# Patient Record
Sex: Male | Born: 1940
Health system: Southern US, Community
[De-identification: ages and names within clinical notes are randomized; demographics above are authoritative.]

## PROBLEM LIST (undated history)

## (undated) DIAGNOSIS — N400 Enlarged prostate without lower urinary tract symptoms: Secondary | ICD-10-CM

## (undated) DIAGNOSIS — I10 Essential (primary) hypertension: Secondary | ICD-10-CM

## (undated) DIAGNOSIS — E119 Type 2 diabetes mellitus without complications: Secondary | ICD-10-CM

## (undated) DIAGNOSIS — R011 Cardiac murmur, unspecified: Secondary | ICD-10-CM

## (undated) DIAGNOSIS — J939 Pneumothorax, unspecified: Secondary | ICD-10-CM

## (undated) HISTORY — DX: Pneumothorax, unspecified: J93.9

## (undated) HISTORY — PX: CATARACT EXTRACTION, BILATERAL: SHX1313

---

## 2004-10-24 ENCOUNTER — Ambulatory Visit (HOSPITAL_COMMUNITY): Admission: RE | Admit: 2004-10-24 | Discharge: 2004-10-24 | Payer: Self-pay | Admitting: *Deleted

## 2014-10-15 HISTORY — PX: OTHER SURGICAL HISTORY: SHX169

## 2015-11-24 DIAGNOSIS — E139 Other specified diabetes mellitus without complications: Secondary | ICD-10-CM | POA: Diagnosis not present

## 2015-12-28 ENCOUNTER — Other Ambulatory Visit: Payer: Self-pay | Admitting: Surgery

## 2015-12-28 DIAGNOSIS — E119 Type 2 diabetes mellitus without complications: Secondary | ICD-10-CM | POA: Diagnosis not present

## 2015-12-28 DIAGNOSIS — K4090 Unilateral inguinal hernia, without obstruction or gangrene, not specified as recurrent: Secondary | ICD-10-CM | POA: Diagnosis not present

## 2016-01-03 DIAGNOSIS — E119 Type 2 diabetes mellitus without complications: Secondary | ICD-10-CM | POA: Diagnosis not present

## 2016-01-03 DIAGNOSIS — I10 Essential (primary) hypertension: Secondary | ICD-10-CM | POA: Diagnosis not present

## 2016-01-09 DIAGNOSIS — E119 Type 2 diabetes mellitus without complications: Secondary | ICD-10-CM | POA: Diagnosis not present

## 2016-01-09 DIAGNOSIS — R011 Cardiac murmur, unspecified: Secondary | ICD-10-CM | POA: Diagnosis not present

## 2016-01-09 DIAGNOSIS — I10 Essential (primary) hypertension: Secondary | ICD-10-CM | POA: Diagnosis not present

## 2016-01-09 DIAGNOSIS — E7800 Pure hypercholesterolemia, unspecified: Secondary | ICD-10-CM | POA: Diagnosis not present

## 2016-01-09 NOTE — Patient Instructions (Addendum)
Vella RaringJames W Mcphail  01/09/2016   Your procedure is scheduled on: 01-17-16  [facility]  [facility]  [facility] to 3rd floor to  Short Stay Center at 515  AM.  Call this number if you have problems the morning of surgery [phone]   Remember: ONLY 1 PERSON MAY GO WITH YOU TO SHORT STAY TO GET  READY MORNING OF YOUR SURGERY.  Do not eat food or drink liquids :After Midnight.    Take these medicines the morning of surgery with A SIP OF WATER: TAMSULOSIN (FLOMAX), OCEAN NASAL SPRAY, LIPITOR DO NOT TAKE ANY DIABETIC MEDICATIONS DAY OF YOUR SURGERY                               You may not have any metal on your body including hair pins and              piercings  Do not wear jewelry, make-up, lotions, powders or perfumes, deodorant             Do not wear nail polish.  Do not shave  48 hours prior to surgery.              Men may shave face and neck.   Do not bring valuables to the hospital. [facility] IS NOT             RESPONSIBLE   FOR VALUABLES.  Contacts, dentures or bridgework may not be worn into surgery.  Leave suitcase in the car. After surgery it may be brought to your room.     Patients discharged the day of surgery will not be allowed to drive home.  Name and phone number of your driver:  Special Instructions: N/A              Please read over the following fact sheets you were given: _____________________________________________________________________             [facility] - Preparing for Surgery Before surgery, you can play an important role.  Because skin is not sterile, your skin needs to be as free of germs as possible.  You can reduce the number of germs on your skin by washing with CHG (chlorahexidine gluconate) soap before surgery.  CHG is an antiseptic cleaner which kills germs and bonds with the skin to continue killing germs even after washing. Please DO NOT use if you have an allergy to CHG or antibacterial soaps.   If your skin becomes reddened/irritated stop using the CHG and inform your nurse when you arrive at Short Stay. Do not shave (including legs and underarms) for at least 48 hours prior to the first CHG shower.  You may shave your face/neck. Please follow these instructions carefully:  1.  Shower with CHG Soap the night before surgery and the  morning of Surgery.  2.  If you choose to wash your hair, wash your hair first as usual with your  normal  shampoo.  3.  After you shampoo, rinse your hair and body thoroughly to remove the  shampoo.                           4.  Use CHG as you would any other liquid soap.  You can apply chg directly  to the  skin and wash                       Gently with a scrungie or clean washcloth.  5.  Apply the CHG Soap to your body ONLY FROM THE NECK DOWN.   Do not use on face/ open                           Wound or open sores. Avoid contact with eyes, ears mouth and genitals (private parts).                       Wash face,  Genitals (private parts) with your normal soap.             6.  Wash thoroughly, paying special attention to the area where your surgery  will be performed.  7.  Thoroughly rinse your body with warm water from the neck down.  8.  DO NOT shower/wash with your normal soap after using and rinsing off  the CHG Soap.                9.  Pat yourself dry with a clean towel.            10.  Wear clean pajamas.            11.  Place clean sheets on your bed the night of your first shower and do not  sleep with pets. Day of Surgery : Do not apply any lotions/deodorants the morning of surgery.  Please wear clean clothes to the hospital/surgery center.  FAILURE TO FOLLOW THESE INSTRUCTIONS MAY RESULT IN THE CANCELLATION OF YOUR SURGERY PATIENT SIGNATURE_________________________________  NURSE SIGNATURE__________________________________  ________________________________________________________________________

## 2016-01-10 ENCOUNTER — Encounter (HOSPITAL_COMMUNITY)
Admission: RE | Admit: 2016-01-10 | Discharge: 2016-01-10 | Disposition: A | Discharge status: Home / Self Care | Payer: Medicare Other | Source: Ambulatory Visit | Attending: Surgery | Admitting: Surgery

## 2016-01-10 ENCOUNTER — Encounter (HOSPITAL_COMMUNITY): Payer: Self-pay

## 2016-01-10 DIAGNOSIS — E119 Type 2 diabetes mellitus without complications: Secondary | ICD-10-CM | POA: Diagnosis not present

## 2016-01-10 DIAGNOSIS — K4020 Bilateral inguinal hernia, without obstruction or gangrene, not specified as recurrent: Secondary | ICD-10-CM | POA: Insufficient documentation

## 2016-01-10 DIAGNOSIS — Z01810 Encounter for preprocedural cardiovascular examination: Secondary | ICD-10-CM | POA: Diagnosis not present

## 2016-01-10 DIAGNOSIS — Z01812 Encounter for preprocedural laboratory examination: Secondary | ICD-10-CM | POA: Diagnosis not present

## 2016-01-10 HISTORY — DX: Type 2 diabetes mellitus without complications: E11.9

## 2016-01-10 HISTORY — DX: Essential (primary) hypertension: I10

## 2016-01-10 HISTORY — DX: Benign prostatic hyperplasia without lower urinary tract symptoms: N40.0

## 2016-01-10 HISTORY — DX: Cardiac murmur, unspecified: R01.1

## 2016-01-10 LAB — CBC
HCT: 42 % (ref 39.0–52.0)
Hemoglobin: 13.7 g/dL (ref 13.0–17.0)
MCH: 30.6 pg (ref 26.0–34.0)
MCHC: 32.6 g/dL (ref 30.0–36.0)
MCV: 94 fL (ref 78.0–100.0)
PLATELETS: 199 10*3/uL (ref 150–400)
RBC: 4.47 MIL/uL (ref 4.22–5.81)
RDW: 13.9 % (ref 11.5–15.5)
WBC: 6.2 10*3/uL (ref 4.0–10.5)

## 2016-01-10 NOTE — Progress Notes (Signed)
CMET, HEMAGLOBIN A1C, LIPID PANEL 01-03-16 DR KIN ON CHART

## 2016-01-16 NOTE — Anesthesia Preprocedure Evaluation (Addendum)
Anesthesia Evaluation  Patient identified by MRN, date of birth, ID band Patient awake    Reviewed: Allergy & Precautions, H&P , NPO status , Patient's Chart, lab work & pertinent test results  Airway Mallampati: III  TM Distance: >3 FB Neck ROM: Full    Dental no notable dental hx. (+) Teeth Intact, Dental Advisory Given   Pulmonary neg pulmonary ROS, former smoker,    Pulmonary exam normal breath sounds clear to auscultation       Cardiovascular hypertension, Pt. on medications  Rhythm:Regular Rate:Normal     Neuro/Psych negative neurological ROS  negative psych ROS   GI/Hepatic negative GI ROS, Neg liver ROS,   Endo/Other  diabetes, Type 2, Oral Hypoglycemic Agents  Renal/GU negative Renal ROS  negative genitourinary   Musculoskeletal   Abdominal   Peds  Hematology negative hematology ROS (+)   Anesthesia Other Findings   Reproductive/Obstetrics negative OB ROS                            Anesthesia Physical Anesthesia Plan  ASA: II  Anesthesia Plan: General   Post-op Pain Management:    Induction: Intravenous  Airway Management Planned: Oral ETT  Additional Equipment:   Intra-op Plan:   Post-operative Plan: Extubation in OR  Informed Consent: I have reviewed the patients History and Physical, chart, labs and discussed the procedure including the risks, benefits and alternatives for the proposed anesthesia with the patient or authorized representative who has indicated his/her understanding and acceptance.   Dental advisory given  Plan Discussed with: CRNA  Anesthesia Plan Comments:         Anesthesia Quick Evaluation

## 2016-01-16 NOTE — H&P (Signed)
Antonio Douglas  Location: [facility] Patient #: [id number] DOB: 29-Jun-1941 Married / Language: English / Race: White Male  History of Present Illness   The patient is a 75 year old male who presents for an evaluation of a hernia.   His PCP is Dr. Pearson Grippe.  He comes wtih his wife, Antonio Douglas.   I saw Mr. Antonio Douglas in 06/29/2015 for the right inguinal hernia. But since I saw him he thinks the inguinal hernia has become larger. It may be causing slightly more symptoms, particularly as the day wears on. At night the hernia is easily reducible.  He does have to void about every hour and a half. The current medicines he's on seemed to control his symptoms.  The hernia is large on my exam, and because of increasing size and increasing symptoms, I think that he will be best served by having this repaired.  History of inguinal hernia (06/2015): He is here with a right inguinal hernia. The patient first noticed a right inguinal hernia in April 2016. He has some discomfort when he first noticed this. Since that time, the pain has resolved. And sometimes he does not see the hernia. He has had no prior abdominal or hernia surgery. He denies stomach, liver, or pancreas disease. He had a colonoscopy by Dr. Ritta Slot in April 2016 and had some diverticulosis and some benign polyps.  I discussed the indications and complications of hernia surgery with the patient. I discussed both the laparoscopic and open approach to hernia repair. The potential risks of hernia surgery include, but are not limited to, bleeding, infection, open surgery, nerve injury, and recurrence of the hernia.  I gave him books again about hernia surgery. I also talked about potential urinary retention. I think that since he has a probably early left inguinal hernia, he would be best served with a laparoscopic bilateral inguinal hernia repair.  Past Medical History: 1. DM - type  II Since 1998. On Metformin and Actos. 2. HTN 3. Hypercholesterolemia 4. Last colonoscopy - [name] - 01/24/2015 Diverticulosis 5. Tremor He has had this a long time. 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 6. BPH Followed by Dr. Monico Blitz - but Flomax has helped him. On Vesicare, but they have changed the categorization of this drug and made it much more expensive. 7. Poor hearing.  Social History: Married. Wife - Antonio Douglas Retired. 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 His son had a lap hernia repair here, but he was unsure of whom did the surgery. He has 4 children who all live near here.  Other Problems [name], MD; 12/28/2015 8:52 AM) Diabetes Mellitus  Allergies Fay Records, CMA; 12/28/2015 10:46 AM) No Known Drug Allergies09/14/2016  Medication History Fay Records, CMA; 12/28/2015 10:46 AM) Atorvastatin Calcium (  Tablet, Oral) Active. Lisinopril (  Tablet, Oral) Active. MetFORMIN HCl (  Tablet, Oral) Active. Pioglitazone HCl (  Tablet, Oral) Active. VESIcare (  Tablet, Oral) Active. Tamsulosin HCl (0.4MG  Capsule, Oral) Active. Aspirin (  Tablet, Oral) Active. Medications Reconciled  Vitals Fay Records CMA; 12/28/2015 10:47 AM) 12/28/2015 10:47 AM Weight: 212.8 lb Height: 72in Body Surface Area: 2.19 m Body Mass Index: 28.86 kg/m  Temp.: 97.80F  Pulse: 79 (Regular)  Resp.: 17 (Unlabored)  BP: 130/70 (Sitting, Left Arm, Standard)   Physical Exam   General: WN older WM alert and generally healthy appearing. He has a visible tremor HEENT: Normal. Pupils equal.  Neck: Supple. No mass. No  thyroid mass.  Lymph Nodes: No supraclavicular or cervical nodes.  Lungs: Clear to auscultation and symmetric breath sounds. Heart: RRR. No murmur or rub.  Abdomen: Soft. No mass. No tenderness. . Normal bowel sounds. No abdominal scars. In a  standing position, he has a medium sized right inguinal hernia.   I now think that he has an early left inguinal hernia.  Extremities: Good strength and ROM in upper and lower extremities.  Neurologic: Grossly intact to motor and sensory function. Psychiatric: Has normal mood and affect. Behavior is normal.   Assessment & Plan  1.  RIGHT GROIN HERNIA (K40.90)  Plan:   Laparoscopic repair of bilateral inguinal hernias.   Schedule for Surgery 2. LEFT INGUINAL HERNIA (K40.90) 3. TYPE 2 DIABETES MELLITUS WITHOUT COMPLICATION, WITHOUT LONG-TERM CURRENT USE OF INSULIN (E11.9)  4. HTN 5. Hypercholesterolemia 6. Tremor He has had this a long time. 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 7. BPH Followed by Dr. Monico Blitz. [place] - but Flomax has helped him. On Vesicare, but they have changed the categorization of this drug and made it much more expensive. 8. Poor hearing.   Antonio Kinavid Daril Warga, MD, [facility] Pager: [phone] Office phone:  [phone]

## 2016-01-17 ENCOUNTER — Ambulatory Visit (HOSPITAL_COMMUNITY): Payer: Medicare Other | Admitting: Anesthesiology

## 2016-01-17 ENCOUNTER — Encounter (HOSPITAL_COMMUNITY)
Admission: RE | Disposition: A | Discharge status: Home / Self Care | Payer: Self-pay | Source: Ambulatory Visit | Attending: Surgery

## 2016-01-17 ENCOUNTER — Encounter (HOSPITAL_COMMUNITY): Payer: Self-pay | Admitting: *Deleted

## 2016-01-17 ENCOUNTER — Observation Stay (HOSPITAL_COMMUNITY)
Admission: RE | Admit: 2016-01-17 | Discharge: 2016-01-17 | Disposition: A | Discharge status: Home / Self Care | Payer: Medicare Other | Source: Ambulatory Visit | Attending: Surgery | Admitting: Surgery

## 2016-01-17 DIAGNOSIS — E7800 Pure hypercholesterolemia, unspecified: Secondary | ICD-10-CM | POA: Diagnosis not present

## 2016-01-17 DIAGNOSIS — K4020 Bilateral inguinal hernia, without obstruction or gangrene, not specified as recurrent: Secondary | ICD-10-CM | POA: Diagnosis not present

## 2016-01-17 DIAGNOSIS — Z79899 Other long term (current) drug therapy: Secondary | ICD-10-CM | POA: Insufficient documentation

## 2016-01-17 DIAGNOSIS — Z87891 Personal history of nicotine dependence: Secondary | ICD-10-CM | POA: Diagnosis not present

## 2016-01-17 DIAGNOSIS — H9190 Unspecified hearing loss, unspecified ear: Secondary | ICD-10-CM | POA: Insufficient documentation

## 2016-01-17 DIAGNOSIS — R251 Tremor, unspecified: Secondary | ICD-10-CM | POA: Insufficient documentation

## 2016-01-17 DIAGNOSIS — E119 Type 2 diabetes mellitus without complications: Secondary | ICD-10-CM | POA: Diagnosis not present

## 2016-01-17 DIAGNOSIS — I10 Essential (primary) hypertension: Secondary | ICD-10-CM | POA: Diagnosis not present

## 2016-01-17 DIAGNOSIS — Z7984 Long term (current) use of oral hypoglycemic drugs: Secondary | ICD-10-CM | POA: Insufficient documentation

## 2016-01-17 DIAGNOSIS — Z7982 Long term (current) use of aspirin: Secondary | ICD-10-CM | POA: Diagnosis not present

## 2016-01-17 DIAGNOSIS — K4021 Bilateral inguinal hernia, without obstruction or gangrene, recurrent: Secondary | ICD-10-CM | POA: Diagnosis present

## 2016-01-17 DIAGNOSIS — N400 Enlarged prostate without lower urinary tract symptoms: Secondary | ICD-10-CM | POA: Insufficient documentation

## 2016-01-17 HISTORY — PX: INGUINAL HERNIA REPAIR: SHX194

## 2016-01-17 LAB — GLUCOSE, CAPILLARY
GLUCOSE-CAPILLARY: 177 mg/dL — AB (ref 65–99)
GLUCOSE-CAPILLARY: 200 mg/dL — AB (ref 65–99)
GLUCOSE-CAPILLARY: 196 mg/dL — AB (ref 65–99)
Glucose-Capillary: 216 mg/dL — ABNORMAL HIGH (ref 65–99)

## 2016-01-17 SURGERY — REPAIR, HERNIA, INGUINAL, BILATERAL, LAPAROSCOPIC
Anesthesia: General | Site: Abdomen | Laterality: Bilateral

## 2016-01-17 MED ORDER — PROMETHAZINE HCL 25 MG/ML IJ SOLN: 6.2500 mg | Freq: Once | INTRAMUSCULAR | Status: DC

## 2016-01-17 MED ORDER — PIOGLITAZONE HCL 30 MG PO TABS
30.0000 mg | ORAL_TABLET | Freq: Every evening | ORAL | Status: DC
  Filled 2016-01-17: qty 1

## 2016-01-17 MED ORDER — LORATADINE 10 MG PO TABS
10.0000 mg | ORAL_TABLET | Freq: Every day | ORAL | Status: DC | PRN
  Filled 2016-01-17: qty 1

## 2016-01-17 MED ORDER — POTASSIUM CHLORIDE IN NACL 20-0.45 MEQ/L-% IV SOLN
INTRAVENOUS | Status: DC
  Administered 2016-01-17: 50 mL/h via INTRAVENOUS
  Filled 2016-01-17: qty 1000

## 2016-01-17 MED ORDER — ONDANSETRON 4 MG PO TBDP: 4.0000 mg | ORAL_TABLET | Freq: Four times a day (QID) | ORAL | Status: DC | PRN

## 2016-01-17 MED ORDER — 0.9 % SODIUM CHLORIDE (POUR BTL) OPTIME
TOPICAL | Status: DC | PRN
  Administered 2016-01-17: 1000 mL

## 2016-01-17 MED ORDER — LACTATED RINGERS IV SOLN
INTRAVENOUS | Status: DC | PRN
  Administered 2016-01-17: 07:00:00 via INTRAVENOUS

## 2016-01-17 MED ORDER — LIDOCAINE HCL (CARDIAC) 20 MG/ML IV SOLN
INTRAVENOUS | Status: DC | PRN
  Administered 2016-01-17: 80 mg via INTRATRACHEAL

## 2016-01-17 MED ORDER — PROPOFOL 10 MG/ML IV BOLUS
INTRAVENOUS | Status: AC
  Filled 2016-01-17: qty 20

## 2016-01-17 MED ORDER — INSULIN ASPART 100 UNIT/ML ~~LOC~~ SOLN
SUBCUTANEOUS | Status: DC
Start: 2016-01-17 — End: 2016-01-17
  Filled 2016-01-17: qty 1

## 2016-01-17 MED ORDER — FENTANYL CITRATE (PF) 100 MCG/2ML IJ SOLN
INTRAMUSCULAR | Status: AC
  Filled 2016-01-17: qty 2

## 2016-01-17 MED ORDER — LACTATED RINGERS IR SOLN
Status: DC | PRN
  Administered 2016-01-17: 1000 mL

## 2016-01-17 MED ORDER — ONDANSETRON HCL 4 MG/2ML IJ SOLN: 4.0000 mg | Freq: Four times a day (QID) | INTRAMUSCULAR | Status: DC | PRN

## 2016-01-17 MED ORDER — METFORMIN HCL 500 MG PO TABS
1000.0000 mg | ORAL_TABLET | Freq: Two times a day (BID) | ORAL | Status: DC
  Filled 2016-01-17 (×2): qty 2

## 2016-01-17 MED ORDER — BUPIVACAINE HCL (PF) 0.25 % IJ SOLN
INTRAMUSCULAR | Status: DC | PRN
  Administered 2016-01-17: 30 mL

## 2016-01-17 MED ORDER — ONDANSETRON HCL 4 MG/2ML IJ SOLN
INTRAMUSCULAR | Status: DC | PRN
Start: 2016-01-17 — End: 2016-01-17
  Administered 2016-01-17: 4 mg via INTRAVENOUS

## 2016-01-17 MED ORDER — HYDROCODONE-ACETAMINOPHEN 5-325 MG PO TABS: 1.0000 | ORAL_TABLET | ORAL | Status: DC | PRN

## 2016-01-17 MED ORDER — DARIFENACIN HYDROBROMIDE ER 7.5 MG PO TB24
7.5000 mg | ORAL_TABLET | Freq: Every day | ORAL | Status: DC
  Administered 2016-01-17: 7.5 mg via ORAL
  Filled 2016-01-17: qty 1

## 2016-01-17 MED ORDER — SUGAMMADEX SODIUM 200 MG/2ML IV SOLN
INTRAVENOUS | Status: DC | PRN
  Administered 2016-01-17: 200 mg via INTRAVENOUS

## 2016-01-17 MED ORDER — PHENYLEPHRINE HCL 10 MG/ML IJ SOLN
10.0000 mg | INTRAMUSCULAR | Status: DC | PRN
  Administered 2016-01-17: 5 ug/min via INTRAVENOUS

## 2016-01-17 MED ORDER — HEPARIN SODIUM (PORCINE) 5000 UNIT/ML IJ SOLN
5000.0000 [IU] | Freq: Three times a day (TID) | INTRAMUSCULAR | Status: DC
  Filled 2016-01-17 (×2): qty 1

## 2016-01-17 MED ORDER — DEXAMETHASONE SODIUM PHOSPHATE 10 MG/ML IJ SOLN
INTRAMUSCULAR | Status: DC | PRN
  Administered 2016-01-17: 10 mg via INTRAVENOUS

## 2016-01-17 MED ORDER — IBUPROFEN 200 MG PO TABS: 600.0000 mg | ORAL_TABLET | Freq: Four times a day (QID) | ORAL | Status: DC | PRN

## 2016-01-17 MED ORDER — ONDANSETRON HCL 4 MG/2ML IJ SOLN
INTRAMUSCULAR | Status: AC
  Filled 2016-01-17: qty 2

## 2016-01-17 MED ORDER — LACTATED RINGERS IV SOLN
INTRAVENOUS | Status: DC
  Administered 2016-01-17: 1000 mL via INTRAVENOUS

## 2016-01-17 MED ORDER — CEFAZOLIN SODIUM-DEXTROSE 2-3 GM-% IV SOLR
INTRAVENOUS | Status: AC
  Filled 2016-01-17: qty 50

## 2016-01-17 MED ORDER — MORPHINE SULFATE (PF) 2 MG/ML IV SOLN: 1.0000 mg | INTRAVENOUS | Status: DC | PRN

## 2016-01-17 MED ORDER — HYDROMORPHONE HCL 1 MG/ML IJ SOLN
0.2500 mg | INTRAMUSCULAR | Status: DC | PRN
Start: 2016-01-17 — End: 2016-01-17
  Administered 2016-01-17: 0.5 mg via INTRAVENOUS

## 2016-01-17 MED ORDER — CEFAZOLIN SODIUM-DEXTROSE 2-4 GM/100ML-% IV SOLN
2.0000 g | INTRAVENOUS | Status: AC
  Administered 2016-01-17: 2 g via INTRAVENOUS

## 2016-01-17 MED ORDER — HYDROMORPHONE HCL 1 MG/ML IJ SOLN
INTRAMUSCULAR | Status: AC
  Filled 2016-01-17: qty 1

## 2016-01-17 MED ORDER — SUCCINYLCHOLINE CHLORIDE 20 MG/ML IJ SOLN
INTRAMUSCULAR | Status: DC | PRN
  Administered 2016-01-17: 100 mg via INTRAVENOUS

## 2016-01-17 MED ORDER — BUPIVACAINE HCL (PF) 0.25 % IJ SOLN
INTRAMUSCULAR | Status: AC
  Filled 2016-01-17: qty 30

## 2016-01-17 MED ORDER — PROPOFOL 10 MG/ML IV BOLUS
INTRAVENOUS | Status: DC | PRN
  Administered 2016-01-17: 100 mg via INTRAVENOUS
  Administered 2016-01-17: 50 mg via INTRAVENOUS

## 2016-01-17 MED ORDER — TAMSULOSIN HCL 0.4 MG PO CAPS
0.4000 mg | ORAL_CAPSULE | Freq: Every morning | ORAL | Status: DC
  Administered 2016-01-17: 0.4 mg via ORAL
  Filled 2016-01-17: qty 1

## 2016-01-17 MED ORDER — LIDOCAINE HCL (CARDIAC) 20 MG/ML IV SOLN
INTRAVENOUS | Status: AC
  Filled 2016-01-17: qty 5

## 2016-01-17 MED ORDER — HYDROCODONE-ACETAMINOPHEN 5-325 MG PO TABS: 1.0000 | ORAL_TABLET | Freq: Four times a day (QID) | ORAL | Status: DC | PRN

## 2016-01-17 MED ORDER — ROCURONIUM BROMIDE 100 MG/10ML IV SOLN
INTRAVENOUS | Status: DC | PRN
  Administered 2016-01-17: 10 mg via INTRAVENOUS
  Administered 2016-01-17: 40 mg via INTRAVENOUS
  Administered 2016-01-17: 5 mg via INTRAVENOUS
  Administered 2016-01-17: 10 mg via INTRAVENOUS

## 2016-01-17 MED ORDER — FENTANYL CITRATE (PF) 250 MCG/5ML IJ SOLN
INTRAMUSCULAR | Status: DC | PRN
  Administered 2016-01-17 (×4): 50 ug via INTRAVENOUS

## 2016-01-17 MED ORDER — INSULIN ASPART 100 UNIT/ML ~~LOC~~ SOLN
0.0000 [IU] | Freq: Three times a day (TID) | SUBCUTANEOUS | Status: DC
  Administered 2016-01-17: 3 [IU] via SUBCUTANEOUS
  Administered 2016-01-17: 5 [IU] via SUBCUTANEOUS

## 2016-01-17 MED ORDER — LISINOPRIL 5 MG PO TABS
5.0000 mg | ORAL_TABLET | Freq: Every morning | ORAL | Status: DC
  Administered 2016-01-17: 5 mg via ORAL
  Filled 2016-01-17: qty 1

## 2016-01-17 MED ORDER — ROCURONIUM BROMIDE 100 MG/10ML IV SOLN
INTRAVENOUS | Status: AC
  Filled 2016-01-17: qty 1

## 2016-01-17 MED ORDER — PROMETHAZINE HCL 25 MG/ML IJ SOLN
INTRAMUSCULAR | Status: AC
  Filled 2016-01-17: qty 1

## 2016-01-17 MED ORDER — SALINE SPRAY 0.65 % NA SOLN
1.0000 | Freq: Every day | NASAL | Status: DC | PRN
  Filled 2016-01-17: qty 44

## 2016-01-17 MED ORDER — DEXAMETHASONE SODIUM PHOSPHATE 10 MG/ML IJ SOLN
INTRAMUSCULAR | Status: AC
  Filled 2016-01-17: qty 1

## 2016-01-17 SURGICAL SUPPLY — 41 items
APL SKNCLS STERI-STRIP NONHPOA (GAUZE/BANDAGES/DRESSINGS) ×1
BENZOIN TINCTURE PRP APPL 2/3 (GAUZE/BANDAGES/DRESSINGS) ×2 IMPLANT
CHLORAPREP W/TINT 26ML (MISCELLANEOUS) ×2 IMPLANT
COVER SURGICAL LIGHT HANDLE (MISCELLANEOUS) ×2 IMPLANT
DECANTER SPIKE VIAL GLASS SM (MISCELLANEOUS) ×2 IMPLANT
DISSECT BALLN SPACEMKR + OVL (BALLOONS) ×2
DISSECTOR BALLN SPACEMKR + OVL (BALLOONS) ×1 IMPLANT
DISSECTOR BLUNT TIP ENDO 5MM (MISCELLANEOUS) IMPLANT
DRAPE LAPAROSCOPIC ABDOMINAL (DRAPES) ×2 IMPLANT
ELECT REM PT RETURN 9FT ADLT (ELECTROSURGICAL) ×2
ELECTRODE REM PT RTRN 9FT ADLT (ELECTROSURGICAL) ×1 IMPLANT
ENDOLOOP SUT PDS II  0 18 (SUTURE) ×1
ENDOLOOP SUT PDS II 0 18 (SUTURE) IMPLANT
GLOVE BIOGEL PI IND STRL 7.0 (GLOVE) ×1 IMPLANT
GLOVE BIOGEL PI INDICATOR 7.0 (GLOVE) ×1
GLOVE SURG SIGNA 7.5 PF LTX (GLOVE) ×2 IMPLANT
GOWN SPEC L4 XLG W/TWL (GOWN DISPOSABLE) ×2 IMPLANT
GOWN STRL REUS W/ TWL XL LVL3 (GOWN DISPOSABLE) ×3 IMPLANT
GOWN STRL REUS W/TWL LRG LVL3 (GOWN DISPOSABLE) ×2 IMPLANT
GOWN STRL REUS W/TWL XL LVL3 (GOWN DISPOSABLE) ×6
KIT BASIN OR (CUSTOM PROCEDURE TRAY) ×2 IMPLANT
LIQUID BAND (GAUZE/BANDAGES/DRESSINGS) ×1 IMPLANT
MESH 3DMAX LIGHT 4.1X6.2 LT LR (Mesh General) ×1 IMPLANT
MESH 3DMAX LIGHT 4.1X6.2 RT LR (Mesh General) ×1 IMPLANT
NDL INSUFFLATION 14GA 120MM (NEEDLE) IMPLANT
NEEDLE INSUFFLATION 14GA 120MM (NEEDLE) IMPLANT
PAD POSITIONING PINK XL (MISCELLANEOUS) IMPLANT
POSITIONER SURGICAL ARM (MISCELLANEOUS) IMPLANT
SET IRRIG TUBING LAPAROSCOPIC (IRRIGATION / IRRIGATOR) IMPLANT
SOLUTION ANTI FOG 6CC (MISCELLANEOUS) ×2 IMPLANT
STRIP CLOSURE SKIN 1/2X4 (GAUZE/BANDAGES/DRESSINGS) ×2 IMPLANT
SUT MNCRL AB 4-0 PS2 18 (SUTURE) ×3 IMPLANT
SUT VIC AB 3-0 SH 18 (SUTURE) ×3 IMPLANT
TACKER 5MM HERNIA 3.5CML NAB (ENDOMECHANICALS) ×3 IMPLANT
TAPE CLOTH 4X10 WHT NS (GAUZE/BANDAGES/DRESSINGS) IMPLANT
TOWEL OR 17X26 10 PK STRL BLUE (TOWEL DISPOSABLE) ×2 IMPLANT
TRAY FOLEY W/METER SILVER 14FR (SET/KITS/TRAYS/PACK) ×1 IMPLANT
TRAY FOLEY W/METER SILVER 16FR (SET/KITS/TRAYS/PACK) ×2 IMPLANT
TRAY LAPAROSCOPIC (CUSTOM PROCEDURE TRAY) ×2 IMPLANT
TROCAR BLADELESS OPT 5 75 (ENDOMECHANICALS) ×4 IMPLANT
TUBING INSUF HEATED (TUBING) ×2 IMPLANT

## 2016-01-17 NOTE — Anesthesia Postprocedure Evaluation (Signed)
Anesthesia Post Note  Patient: Antonio RaringJames W Douglas  Procedure(s) Performed: Procedure(s) (LRB): LAPAROSCOPIC BILATERAL INGUINAL HERNIA REPAIR (Bilateral)  Patient location during evaluation: PACU Anesthesia Type: General Level of consciousness: awake and alert Pain management: pain level controlled Vital Signs Assessment: post-procedure vital signs reviewed and stable Respiratory status: spontaneous breathing, nonlabored ventilation, respiratory function stable and patient connected to nasal cannula oxygen Cardiovascular status: blood pressure returned to baseline and stable Postop Assessment: no signs of nausea or vomiting Anesthetic complications: no    Last Vitals:  Filed Vitals:   01/17/16 1045 01/17/16 1100  BP: 146/77 156/79  Pulse: 68 67  Temp:    Resp: 7 9    Last Pain:  Filed Vitals:   01/17/16 1107  PainSc: Asleep                 Deby Adger,W. EDMOND

## 2016-01-17 NOTE — Progress Notes (Signed)
Assessment unchanged.  Scripts were given per MD order.  IV removed. All questions pertaining to D/C we answered.  Pt was D/C'd via wheelchair and accompanied by RN.  Sherron MondayGood, Stanlee Roehrig L

## 2016-01-17 NOTE — Op Note (Signed)
01/17/2016  9:12 AM  PATIENT:  Antonio Douglas, 75 y.o., male  MRN: [id number]  DOB: [birth year]  PREOP DIAGNOSIS:  bilateral inguinal hernias  POSTOP DIAGNOSIS:   Bilateral inguinal hernias, right large indirect, left small direct inguinal hernia  PROCEDURE:   Procedure(s): LAPAROSCOPIC BILATERAL INGUINAL HERNIA REPAIR  SURGEON:   Ovidio Kinavid Lam Mccubbins, M.D.  ANESTHESIA:   general  Anesthesiologist: Gaynelle AduWilliam Fitzgerald, MD CRNA: Delphia GratesLoraine Chandler, CRNA  General  EBL:  minimal  ml  LOCAL MEDICATIONS USED:   30 cc 1/4% marcaine  SPECIMEN:   None  COUNTS CORRECT:  YES  INDICATIONS FOR PROCEDURE:  Antonio Douglas is a 75 y.o. (DOB: [birth year]) white  male whose primary care physician is Pearson GrippeJames Kim, MD and comes for bilateral inguinal hernia repair.   The indications and risks of the hernia surgery were explained to the patient.  The risks include, but are not limited to, infection, bleeding, recurrence of the hernia, and nerve injury.  Operative Note:  The patient [facility].  He underwent a general anesthesia.     A time out was held and the surgical checklist run.   His lower abdomen was shaved and then prepped with chloroprep.  He had a foley catheter placed without difficulty.   I made an infraumbilical incision and cut down to the right anterior rectus fascia.  I went the right side, opened the anterior rectus fascia, retracted the rectus muscle and placed the PBD balloon in the preperitoneal space.  The balloon was insufflated under direct visualization.  The balloon was in the correct position and the dissection created a preperitoneal space.   The patient had a large indirect right inguinal hernia and a small lateral direct left inguinal hernia.  I dissected the preperitoneal fat posteriorly.  The cord structures were encircled.  On the right, he had a fairly long indirect inguinal hernia.  I placed a PDS endoloop around the base of the hernia sac.  The  peritoneum was reduced to below the level of the anterior iliac spine.   On the left side, the cord structures were encircled.   The peritoneum was reduced to below the level of the anterior iliac spine.  I did not see an indirect inguinal hernia.  He did have a small lateral direct inguinal hernia.   I then placed 2 pieces mesh in the preperitoneal space - one on the right and one on the left.  I used the Bard 3D Max Light Mesh (10.3 cm x 15.7 cm).  The mesh lay flat.  I used the Protac to secure the mesh.  I used 8 clips on the right and 9 clips on the left.  The mesh was tacked medially to the pubic bone, inferiorly to Cooper's ligament, superiorly to transversalis fascia, and laterally.  I did tack the right indirect inguinal hernia sac to the undersurface of the mesh.  I avoided tacks in the area lateral to the iliac vessels and laterally below the ileo-inguinal ligament.   Photos were taken and placed in the chart.  The preperitoneal space was deflated, there were no gaps around the mesh, and the trocars removed under direct visualization.   I infiltrated the wounds with 30 cc of 1/4% Marcaine.  The anterior rectus fascia was closed with 0 Vicryl.  The skin was closed with 4-0 monocryl and painted with LiquidBand. The sponge and needle count were correct at the end of the case.  The  patient was transported to the recovery room in good condition.  His foley was removed.   Because of his history of prostate trouble, I will keep the patient overnight for observation.  Ovidio Kin, MD, [facility] Pager: [phone] Office phone:  [phone]

## 2016-01-17 NOTE — Discharge Instructions (Signed)
[facility] - DISCHARGE INSTRUCTIONS TO PATIENT  Activity:  Driving - May drive in 3 or 4 days, if not hurting and taking minimal pain meds   Lifting - No lifting more than 15 pounds for 4 weeks.  Wound Care:   May shower starting Thursday, 4/6.  Diet:  As tolerated.  Follow up appointment:  Call Dr. Allene PyoNewman's office [facility] at [phone] for an appointment in 2 to 3 weeks.  Medications and dosages:  Resume your home medications.  You have a prescription for:  Enablex and Vicodin  Call Dr. Ezzard StandingNewman or his office  [phone]) if you have:  Temperature greater than 100.4,  Persistent nausea and vomiting,  Severe uncontrolled pain,  Redness, tenderness, or signs of infection (pain, swelling, redness, odor or green/yellow discharge around the site),  Difficulty breathing, headache or visual disturbances,  Any other questions or concerns you may have after discharge.  In an emergency, call 911 or go to an Emergency Department at a nearby hospital.

## 2016-01-17 NOTE — Transfer of Care (Signed)
Immediate Anesthesia Transfer of Care Note  Patient: Antonio Douglas  Procedure(s) Performed: Procedure(s): LAPAROSCOPIC BILATERAL INGUINAL HERNIA REPAIR (Bilateral)  Patient Location: PACU  Anesthesia Type:General  Level of Consciousness: awake, alert , oriented and patient cooperative  Airway & Oxygen Therapy: Patient Spontanous Breathing and Patient connected to face mask oxygen  Post-op Assessment: Report given to RN and Post -op Vital signs reviewed and stable  Post vital signs: Reviewed and stable  Last Vitals:  Filed Vitals:   01/17/16 0525  BP: 147/68  Pulse: 77  Temp: 36.8 C  Resp: 16    Complications: No apparent anesthesia complications

## 2016-01-17 NOTE — Anesthesia Procedure Notes (Signed)
Procedure Name: Intubation Date/Time: 01/17/2016 7:24 AM Performed by: Delphia GratesHANDLER, Vir Whetstine Pre-anesthesia Checklist: Emergency Drugs available, Suction available, Patient being monitored and Patient identified Patient Re-evaluated:Patient Re-evaluated prior to inductionOxygen Delivery Method: Circle system utilized Preoxygenation: Pre-oxygenation with 100% oxygen Intubation Type: IV induction and Cricoid Pressure applied Laryngoscope Size: Mac and 4 Grade View: Grade II Tube type: Oral Tube size: 7.5 mm Number of attempts: 2 Airway Equipment and Method: Stylet Placement Confirmation: ETT inserted through vocal cords under direct vision,  positive ETCO2 and breath sounds checked- equal and bilateral Secured at: 22 cm Tube secured with: Tape Dental Injury: Teeth and Oropharynx as per pre-operative assessment  Difficulty Due To: Difficulty was anticipated and Difficult Airway- due to anterior larynx Comments: Ist attempt by EMS student Eileen StanfordJenna. @nd  attempt by ChandlerCRNA. Anterior cords, cricoid needed.

## 2016-01-17 NOTE — Interval H&P Note (Signed)
History and Physical Interval Note:  01/17/2016 7:12 AM  Antonio RaringJames W Douglas  has presented today for surgery, with the diagnosis of bilateral inguinal hernias  The various methods of treatment have been discussed with the patient and family.  His wife is with him.  After consideration of risks, benefits and other options for treatment, the patient has consented to  Procedure(s): LAPAROSCOPIC BILATERAL INGUINAL HERNIA REPAIR (Bilateral) as a surgical intervention .  The patient's history has been reviewed, patient examined, no change in status, stable for surgery.  I have reviewed the patient's chart and labs.  Questions were answered to the patient's satisfaction.     [name]

## 2016-01-31 NOTE — Discharge Summary (Signed)
Physician Discharge Summary  Patient ID:  Antonio Douglas  MRN: [id number]  DOB/AGE: December 27, 1940 75 y.o.  Admit date: 01/17/2016 Discharge date: 01/17/2016  Discharge Diagnoses:  1.RIGHT GROIN HERNIA (K40.90) 2. LEFT INGUINAL HERNIA (K40.90)  3. TYPE 2 DIABETES MELLITUS WITHOUT COMPLICATION, WITHOUT LONG-TERM CURRENT USE OF INSULIN (E11.9)  4. HTN 5. Hypercholesterolemia 6. Tremor He has had this a long time. 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 7. BPH Followed by Dr. Monico Blitz - but Flomax has helped him. On Vesicare, but they have changed the categorization of this drug and made it much more expensive. 8. Poor hearing.   Active Problems:   Bilateral recurrent inguinal hernias  Operation: Procedure(s): LAPAROSCOPIC BILATERAL INGUINAL HERNIA REPAIR on 01/17/2016 - D. [facility]  Discharged Condition: good  Hospital Course: Antonio Douglas is an 75 y.o. male whose primary care physician is Pearson Grippe, MD and who was admitted 01/17/2016 with a chief complaint of bilateral inguinal hernias.   He was brought to the operating room on 01/17/2016 and underwent  LAPAROSCOPIC BILATERAL INGUINAL HERNIA REPAIR.   He voided and was ready to go home the day of surgery.  So he was discharged as an outpatient (he went home the same day).  I not totally sure what a discharge summary is needed.  He was ready to go home. The discharge instructions were reviewed with the patient.  Consults: None  Significant Diagnostic Studies: Results for orders placed or performed during the hospital encounter of 01/17/16  Glucose, capillary  Result Value Ref Range   Glucose-Capillary 177 (H) 65 - 99 mg/dL   Comment 1 Notify RN    Comment 2 Document in Chart   Glucose, capillary  Result Value Ref Range   Glucose-Capillary 216 (H) 65 - 99 mg/dL  Glucose, capillary  Result Value Ref Range   Glucose-Capillary 200 (H) 65 - 99 mg/dL   Comment 1 Document in Chart    Comment 2 Repeat Test   Glucose,  capillary  Result Value Ref Range   Glucose-Capillary 196 (H) 65 - 99 mg/dL    No results found.  Discharge Exam:  Filed Vitals:   01/17/16 1330 01/17/16 1430  BP: 148/66 140/65  Pulse: 67 72  Temp: 98.6 F (37 C) 98.6 F (37 C)  Resp: 18 17    General: WN Older WF who is alert and generally healthy appearing.  Lungs: Clear to auscultation and symmetric breath sounds. Heart:  RRR. No murmur or rub. Abdomen: Soft. No mass.  Normal bowel sounds.  Incisions look good.  Discharge Medications:     Medication List    TAKE these medications        aspirin EC 81 MG tablet  Take 81 mg by mouth every evening.     atorvastatin 20 MG tablet  Commonly known as:  LIPITOR  Take 20 mg by mouth daily.     HYDROcodone-acetaminophen 5-325 MG tablet  Commonly known as:  NORCO/VICODIN  Take 1-2 tablets by mouth every 6 (six) hours as needed for moderate pain.     lisinopril 5 MG tablet  Commonly known as:  PRINIVIL,ZESTRIL  Take 5 mg by mouth every morning.     loratadine 10 MG tablet  Commonly known as:  CLARITIN  Take 10 mg by mouth daily as needed for allergies.     metFORMIN 1000 MG tablet  Commonly known as:  GLUCOPHAGE  Take 1,000 mg by mouth 2 (two) times daily.  pioglitazone 30 MG tablet  Commonly known as:  ACTOS  Take 30 mg by mouth every evening.     sodium chloride 0.65 % Soln nasal spray  Commonly known as:  OCEAN  Place 1 spray into both nostrils daily as needed for congestion.     solifenacin 10 MG tablet  Commonly known as:  VESICARE  Take 10 mg by mouth daily.     tamsulosin 0.4 MG Caps capsule  Commonly known as:  FLOMAX  Take 0.4 mg by mouth every morning.        Disposition: 01-Home or Self Care      Discharge Instructions    Diet - low sodium heart healthy    Complete by:  As directed      Increase activity slowly    Complete by:  As directed             Signed: Ovidio Kinavid Laneya Gasaway, M.D., [facility] Office:   [phone]  01/31/2016, 10:10 AM

## 2016-02-06 DIAGNOSIS — N401 Enlarged prostate with lower urinary tract symptoms: Secondary | ICD-10-CM | POA: Diagnosis not present

## 2016-02-06 DIAGNOSIS — R3915 Urgency of urination: Secondary | ICD-10-CM | POA: Diagnosis not present

## 2016-02-07 DIAGNOSIS — R011 Cardiac murmur, unspecified: Secondary | ICD-10-CM | POA: Diagnosis not present

## 2016-03-08 DIAGNOSIS — I358 Other nonrheumatic aortic valve disorders: Secondary | ICD-10-CM | POA: Diagnosis not present

## 2016-03-08 DIAGNOSIS — R0989 Other specified symptoms and signs involving the circulatory and respiratory systems: Secondary | ICD-10-CM | POA: Diagnosis not present

## 2016-03-08 DIAGNOSIS — E7800 Pure hypercholesterolemia, unspecified: Secondary | ICD-10-CM | POA: Diagnosis not present

## 2016-03-14 DIAGNOSIS — R0989 Other specified symptoms and signs involving the circulatory and respiratory systems: Secondary | ICD-10-CM | POA: Diagnosis not present

## 2016-04-04 DIAGNOSIS — M5136 Other intervertebral disc degeneration, lumbar region: Secondary | ICD-10-CM | POA: Diagnosis not present

## 2016-04-04 DIAGNOSIS — M542 Cervicalgia: Secondary | ICD-10-CM | POA: Diagnosis not present

## 2016-04-04 DIAGNOSIS — M9903 Segmental and somatic dysfunction of lumbar region: Secondary | ICD-10-CM | POA: Diagnosis not present

## 2016-04-04 DIAGNOSIS — M545 Low back pain: Secondary | ICD-10-CM | POA: Diagnosis not present

## 2016-04-05 DIAGNOSIS — E119 Type 2 diabetes mellitus without complications: Secondary | ICD-10-CM | POA: Diagnosis not present

## 2016-04-05 DIAGNOSIS — I10 Essential (primary) hypertension: Secondary | ICD-10-CM | POA: Diagnosis not present

## 2016-04-09 DIAGNOSIS — M545 Low back pain: Secondary | ICD-10-CM | POA: Diagnosis not present

## 2016-04-09 DIAGNOSIS — M542 Cervicalgia: Secondary | ICD-10-CM | POA: Diagnosis not present

## 2016-04-09 DIAGNOSIS — M9903 Segmental and somatic dysfunction of lumbar region: Secondary | ICD-10-CM | POA: Diagnosis not present

## 2016-04-09 DIAGNOSIS — M5136 Other intervertebral disc degeneration, lumbar region: Secondary | ICD-10-CM | POA: Diagnosis not present

## 2016-04-10 DIAGNOSIS — R5383 Other fatigue: Secondary | ICD-10-CM | POA: Diagnosis not present

## 2016-04-10 DIAGNOSIS — E7800 Pure hypercholesterolemia, unspecified: Secondary | ICD-10-CM | POA: Diagnosis not present

## 2016-04-10 DIAGNOSIS — I10 Essential (primary) hypertension: Secondary | ICD-10-CM | POA: Diagnosis not present

## 2016-04-10 DIAGNOSIS — E119 Type 2 diabetes mellitus without complications: Secondary | ICD-10-CM | POA: Diagnosis not present

## 2016-06-15 DIAGNOSIS — E119 Type 2 diabetes mellitus without complications: Secondary | ICD-10-CM | POA: Diagnosis not present

## 2016-07-03 DIAGNOSIS — Z23 Encounter for immunization: Secondary | ICD-10-CM | POA: Diagnosis not present

## 2016-07-18 DIAGNOSIS — I10 Essential (primary) hypertension: Secondary | ICD-10-CM | POA: Diagnosis not present

## 2016-07-18 DIAGNOSIS — R5383 Other fatigue: Secondary | ICD-10-CM | POA: Diagnosis not present

## 2016-07-18 DIAGNOSIS — E119 Type 2 diabetes mellitus without complications: Secondary | ICD-10-CM | POA: Diagnosis not present

## 2016-07-25 DIAGNOSIS — I10 Essential (primary) hypertension: Secondary | ICD-10-CM | POA: Diagnosis not present

## 2016-07-25 DIAGNOSIS — Z125 Encounter for screening for malignant neoplasm of prostate: Secondary | ICD-10-CM | POA: Diagnosis not present

## 2016-07-25 DIAGNOSIS — E119 Type 2 diabetes mellitus without complications: Secondary | ICD-10-CM | POA: Diagnosis not present

## 2016-07-25 DIAGNOSIS — E7800 Pure hypercholesterolemia, unspecified: Secondary | ICD-10-CM | POA: Diagnosis not present

## 2016-08-09 DIAGNOSIS — Z23 Encounter for immunization: Secondary | ICD-10-CM | POA: Diagnosis not present

## 2016-11-21 DIAGNOSIS — E119 Type 2 diabetes mellitus without complications: Secondary | ICD-10-CM | POA: Diagnosis not present

## 2016-11-21 DIAGNOSIS — D649 Anemia, unspecified: Secondary | ICD-10-CM | POA: Diagnosis not present

## 2016-11-21 DIAGNOSIS — I10 Essential (primary) hypertension: Secondary | ICD-10-CM | POA: Diagnosis not present

## 2016-11-21 DIAGNOSIS — Z125 Encounter for screening for malignant neoplasm of prostate: Secondary | ICD-10-CM | POA: Diagnosis not present

## 2016-11-26 DIAGNOSIS — Z0001 Encounter for general adult medical examination with abnormal findings: Secondary | ICD-10-CM | POA: Diagnosis not present

## 2016-11-26 DIAGNOSIS — D519 Vitamin B12 deficiency anemia, unspecified: Secondary | ICD-10-CM | POA: Diagnosis not present

## 2016-12-07 DIAGNOSIS — R972 Elevated prostate specific antigen [PSA]: Secondary | ICD-10-CM | POA: Diagnosis not present

## 2016-12-07 DIAGNOSIS — R3915 Urgency of urination: Secondary | ICD-10-CM | POA: Diagnosis not present

## 2016-12-07 DIAGNOSIS — N138 Other obstructive and reflux uropathy: Secondary | ICD-10-CM | POA: Diagnosis not present

## 2016-12-07 DIAGNOSIS — N401 Enlarged prostate with lower urinary tract symptoms: Secondary | ICD-10-CM | POA: Diagnosis not present

## 2016-12-26 DIAGNOSIS — E538 Deficiency of other specified B group vitamins: Secondary | ICD-10-CM | POA: Diagnosis not present

## 2017-01-10 DIAGNOSIS — I10 Essential (primary) hypertension: Secondary | ICD-10-CM | POA: Diagnosis not present

## 2017-01-10 DIAGNOSIS — E119 Type 2 diabetes mellitus without complications: Secondary | ICD-10-CM | POA: Diagnosis not present

## 2017-01-10 DIAGNOSIS — E7800 Pure hypercholesterolemia, unspecified: Secondary | ICD-10-CM | POA: Diagnosis not present

## 2017-01-10 DIAGNOSIS — R251 Tremor, unspecified: Secondary | ICD-10-CM | POA: Diagnosis not present

## 2017-01-30 DIAGNOSIS — E538 Deficiency of other specified B group vitamins: Secondary | ICD-10-CM | POA: Diagnosis not present

## 2017-02-04 DIAGNOSIS — R3915 Urgency of urination: Secondary | ICD-10-CM | POA: Diagnosis not present

## 2017-02-04 DIAGNOSIS — N401 Enlarged prostate with lower urinary tract symptoms: Secondary | ICD-10-CM | POA: Diagnosis not present

## 2017-02-04 DIAGNOSIS — N138 Other obstructive and reflux uropathy: Secondary | ICD-10-CM | POA: Diagnosis not present

## 2017-02-04 DIAGNOSIS — R972 Elevated prostate specific antigen [PSA]: Secondary | ICD-10-CM | POA: Diagnosis not present

## 2017-02-18 DIAGNOSIS — E538 Deficiency of other specified B group vitamins: Secondary | ICD-10-CM | POA: Diagnosis not present

## 2017-02-18 DIAGNOSIS — E119 Type 2 diabetes mellitus without complications: Secondary | ICD-10-CM | POA: Diagnosis not present

## 2017-02-18 DIAGNOSIS — I10 Essential (primary) hypertension: Secondary | ICD-10-CM | POA: Diagnosis not present

## 2017-02-22 DIAGNOSIS — E7800 Pure hypercholesterolemia, unspecified: Secondary | ICD-10-CM | POA: Diagnosis not present

## 2017-02-22 DIAGNOSIS — E538 Deficiency of other specified B group vitamins: Secondary | ICD-10-CM | POA: Diagnosis not present

## 2017-02-22 DIAGNOSIS — E119 Type 2 diabetes mellitus without complications: Secondary | ICD-10-CM | POA: Diagnosis not present

## 2017-02-22 DIAGNOSIS — I10 Essential (primary) hypertension: Secondary | ICD-10-CM | POA: Diagnosis not present

## 2017-02-25 DIAGNOSIS — M545 Low back pain: Secondary | ICD-10-CM | POA: Diagnosis not present

## 2017-02-25 DIAGNOSIS — M9903 Segmental and somatic dysfunction of lumbar region: Secondary | ICD-10-CM | POA: Diagnosis not present

## 2017-02-25 DIAGNOSIS — M5136 Other intervertebral disc degeneration, lumbar region: Secondary | ICD-10-CM | POA: Diagnosis not present

## 2017-02-25 DIAGNOSIS — M542 Cervicalgia: Secondary | ICD-10-CM | POA: Diagnosis not present

## 2017-02-26 DIAGNOSIS — M545 Low back pain: Secondary | ICD-10-CM | POA: Diagnosis not present

## 2017-02-26 DIAGNOSIS — M542 Cervicalgia: Secondary | ICD-10-CM | POA: Diagnosis not present

## 2017-02-26 DIAGNOSIS — M9903 Segmental and somatic dysfunction of lumbar region: Secondary | ICD-10-CM | POA: Diagnosis not present

## 2017-02-26 DIAGNOSIS — M5136 Other intervertebral disc degeneration, lumbar region: Secondary | ICD-10-CM | POA: Diagnosis not present

## 2017-02-28 DIAGNOSIS — M5136 Other intervertebral disc degeneration, lumbar region: Secondary | ICD-10-CM | POA: Diagnosis not present

## 2017-02-28 DIAGNOSIS — M542 Cervicalgia: Secondary | ICD-10-CM | POA: Diagnosis not present

## 2017-02-28 DIAGNOSIS — M545 Low back pain: Secondary | ICD-10-CM | POA: Diagnosis not present

## 2017-02-28 DIAGNOSIS — M9903 Segmental and somatic dysfunction of lumbar region: Secondary | ICD-10-CM | POA: Diagnosis not present

## 2017-03-04 DIAGNOSIS — M542 Cervicalgia: Secondary | ICD-10-CM | POA: Diagnosis not present

## 2017-03-04 DIAGNOSIS — M5136 Other intervertebral disc degeneration, lumbar region: Secondary | ICD-10-CM | POA: Diagnosis not present

## 2017-03-04 DIAGNOSIS — M545 Low back pain: Secondary | ICD-10-CM | POA: Diagnosis not present

## 2017-03-04 DIAGNOSIS — M9903 Segmental and somatic dysfunction of lumbar region: Secondary | ICD-10-CM | POA: Diagnosis not present

## 2017-03-06 DIAGNOSIS — M542 Cervicalgia: Secondary | ICD-10-CM | POA: Diagnosis not present

## 2017-03-06 DIAGNOSIS — M5136 Other intervertebral disc degeneration, lumbar region: Secondary | ICD-10-CM | POA: Diagnosis not present

## 2017-03-06 DIAGNOSIS — M545 Low back pain: Secondary | ICD-10-CM | POA: Diagnosis not present

## 2017-03-06 DIAGNOSIS — M9903 Segmental and somatic dysfunction of lumbar region: Secondary | ICD-10-CM | POA: Diagnosis not present

## 2017-03-07 DIAGNOSIS — M9903 Segmental and somatic dysfunction of lumbar region: Secondary | ICD-10-CM | POA: Diagnosis not present

## 2017-03-07 DIAGNOSIS — M545 Low back pain: Secondary | ICD-10-CM | POA: Diagnosis not present

## 2017-03-07 DIAGNOSIS — M542 Cervicalgia: Secondary | ICD-10-CM | POA: Diagnosis not present

## 2017-03-07 DIAGNOSIS — M5136 Other intervertebral disc degeneration, lumbar region: Secondary | ICD-10-CM | POA: Diagnosis not present

## 2017-03-12 DIAGNOSIS — M5136 Other intervertebral disc degeneration, lumbar region: Secondary | ICD-10-CM | POA: Diagnosis not present

## 2017-03-12 DIAGNOSIS — M545 Low back pain: Secondary | ICD-10-CM | POA: Diagnosis not present

## 2017-03-12 DIAGNOSIS — M542 Cervicalgia: Secondary | ICD-10-CM | POA: Diagnosis not present

## 2017-03-12 DIAGNOSIS — M9903 Segmental and somatic dysfunction of lumbar region: Secondary | ICD-10-CM | POA: Diagnosis not present

## 2017-03-27 DIAGNOSIS — E538 Deficiency of other specified B group vitamins: Secondary | ICD-10-CM | POA: Diagnosis not present

## 2017-04-02 DIAGNOSIS — M5136 Other intervertebral disc degeneration, lumbar region: Secondary | ICD-10-CM | POA: Diagnosis not present

## 2017-04-02 DIAGNOSIS — M542 Cervicalgia: Secondary | ICD-10-CM | POA: Diagnosis not present

## 2017-04-02 DIAGNOSIS — M545 Low back pain: Secondary | ICD-10-CM | POA: Diagnosis not present

## 2017-04-02 DIAGNOSIS — M9903 Segmental and somatic dysfunction of lumbar region: Secondary | ICD-10-CM | POA: Diagnosis not present

## 2017-04-26 ENCOUNTER — Ambulatory Visit (INDEPENDENT_AMBULATORY_CARE_PROVIDER_SITE_OTHER): Payer: Medicare Other | Admitting: Podiatry

## 2017-04-26 ENCOUNTER — Encounter: Payer: Self-pay | Admitting: Podiatry

## 2017-04-26 VITALS — BP 131/108 | HR 63

## 2017-04-26 DIAGNOSIS — B351 Tinea unguium: Secondary | ICD-10-CM

## 2017-04-26 NOTE — Progress Notes (Signed)
Subjective:    Patient ID: Antonio Douglas, male   DOB: 76 y.o.   MRN: [id number]   HPI patient presents with thick nail bed right hallux third nail without history of trauma    Review of Systems  All other systems reviewed and are negative.       Objective:  Physical Exam  Cardiovascular: Intact distal pulses.   Musculoskeletal: Normal range of motion.  Neurological: He is alert.  Skin: Skin is warm and dry.  Nursing note and vitals reviewed.  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 Education rendered to patient

## 2017-04-26 NOTE — Progress Notes (Signed)
   Subjective:    Patient ID: Antonio Douglas, male    DOB: 08/18/1941, 76 y.o.   MRN: [id number]  HPI  Chief Complaint  Patient presents with  . Nail Problem    Fungus big toe Rt and 3rd toe Lt onset 6 months       Review of Systems  HENT: Positive for congestion.   Eyes: Positive for itching.  Genitourinary: Positive for urgency.  Hematological: Bruises/bleeds easily.  All other systems reviewed and are negative.      Objective:   Physical Exam        Assessment & Plan:

## 2017-05-02 DIAGNOSIS — M9903 Segmental and somatic dysfunction of lumbar region: Secondary | ICD-10-CM | POA: Diagnosis not present

## 2017-05-02 DIAGNOSIS — M542 Cervicalgia: Secondary | ICD-10-CM | POA: Diagnosis not present

## 2017-05-02 DIAGNOSIS — M545 Low back pain: Secondary | ICD-10-CM | POA: Diagnosis not present

## 2017-05-02 DIAGNOSIS — M5136 Other intervertebral disc degeneration, lumbar region: Secondary | ICD-10-CM | POA: Diagnosis not present

## 2017-05-07 DIAGNOSIS — M542 Cervicalgia: Secondary | ICD-10-CM | POA: Diagnosis not present

## 2017-05-07 DIAGNOSIS — M545 Low back pain: Secondary | ICD-10-CM | POA: Diagnosis not present

## 2017-05-07 DIAGNOSIS — M9903 Segmental and somatic dysfunction of lumbar region: Secondary | ICD-10-CM | POA: Diagnosis not present

## 2017-05-07 DIAGNOSIS — M5136 Other intervertebral disc degeneration, lumbar region: Secondary | ICD-10-CM | POA: Diagnosis not present

## 2017-05-08 DIAGNOSIS — E538 Deficiency of other specified B group vitamins: Secondary | ICD-10-CM | POA: Diagnosis not present

## 2017-05-20 DIAGNOSIS — I10 Essential (primary) hypertension: Secondary | ICD-10-CM | POA: Diagnosis not present

## 2017-05-20 DIAGNOSIS — E119 Type 2 diabetes mellitus without complications: Secondary | ICD-10-CM | POA: Diagnosis not present

## 2017-05-20 DIAGNOSIS — E538 Deficiency of other specified B group vitamins: Secondary | ICD-10-CM | POA: Diagnosis not present

## 2017-05-27 DIAGNOSIS — E119 Type 2 diabetes mellitus without complications: Secondary | ICD-10-CM | POA: Diagnosis not present

## 2017-05-27 DIAGNOSIS — E7800 Pure hypercholesterolemia, unspecified: Secondary | ICD-10-CM | POA: Diagnosis not present

## 2017-05-27 DIAGNOSIS — I10 Essential (primary) hypertension: Secondary | ICD-10-CM | POA: Diagnosis not present

## 2017-06-06 DIAGNOSIS — M5136 Other intervertebral disc degeneration, lumbar region: Secondary | ICD-10-CM | POA: Diagnosis not present

## 2017-06-06 DIAGNOSIS — M545 Low back pain: Secondary | ICD-10-CM | POA: Diagnosis not present

## 2017-06-06 DIAGNOSIS — M542 Cervicalgia: Secondary | ICD-10-CM | POA: Diagnosis not present

## 2017-06-06 DIAGNOSIS — M9903 Segmental and somatic dysfunction of lumbar region: Secondary | ICD-10-CM | POA: Diagnosis not present

## 2017-06-18 DIAGNOSIS — E119 Type 2 diabetes mellitus without complications: Secondary | ICD-10-CM | POA: Diagnosis not present

## 2017-06-18 DIAGNOSIS — H25042 Posterior subcapsular polar age-related cataract, left eye: Secondary | ICD-10-CM | POA: Diagnosis not present

## 2017-06-25 DIAGNOSIS — H2513 Age-related nuclear cataract, bilateral: Secondary | ICD-10-CM | POA: Diagnosis not present

## 2017-07-04 DIAGNOSIS — M545 Low back pain: Secondary | ICD-10-CM | POA: Diagnosis not present

## 2017-07-04 DIAGNOSIS — M9903 Segmental and somatic dysfunction of lumbar region: Secondary | ICD-10-CM | POA: Diagnosis not present

## 2017-07-04 DIAGNOSIS — M542 Cervicalgia: Secondary | ICD-10-CM | POA: Diagnosis not present

## 2017-07-04 DIAGNOSIS — M5136 Other intervertebral disc degeneration, lumbar region: Secondary | ICD-10-CM | POA: Diagnosis not present

## 2017-07-12 DIAGNOSIS — Z23 Encounter for immunization: Secondary | ICD-10-CM | POA: Diagnosis not present

## 2017-07-18 DIAGNOSIS — H21562 Pupillary abnormality, left eye: Secondary | ICD-10-CM | POA: Diagnosis not present

## 2017-07-18 DIAGNOSIS — H2512 Age-related nuclear cataract, left eye: Secondary | ICD-10-CM | POA: Diagnosis not present

## 2017-07-18 DIAGNOSIS — H25812 Combined forms of age-related cataract, left eye: Secondary | ICD-10-CM | POA: Diagnosis not present

## 2017-07-18 DIAGNOSIS — H2181 Floppy iris syndrome: Secondary | ICD-10-CM | POA: Diagnosis not present

## 2017-07-30 DIAGNOSIS — M542 Cervicalgia: Secondary | ICD-10-CM | POA: Diagnosis not present

## 2017-07-30 DIAGNOSIS — M545 Low back pain: Secondary | ICD-10-CM | POA: Diagnosis not present

## 2017-07-30 DIAGNOSIS — M5136 Other intervertebral disc degeneration, lumbar region: Secondary | ICD-10-CM | POA: Diagnosis not present

## 2017-07-30 DIAGNOSIS — M9903 Segmental and somatic dysfunction of lumbar region: Secondary | ICD-10-CM | POA: Diagnosis not present

## 2017-08-05 DIAGNOSIS — M9903 Segmental and somatic dysfunction of lumbar region: Secondary | ICD-10-CM | POA: Diagnosis not present

## 2017-08-05 DIAGNOSIS — M5136 Other intervertebral disc degeneration, lumbar region: Secondary | ICD-10-CM | POA: Diagnosis not present

## 2017-08-05 DIAGNOSIS — M545 Low back pain: Secondary | ICD-10-CM | POA: Diagnosis not present

## 2017-08-05 DIAGNOSIS — M542 Cervicalgia: Secondary | ICD-10-CM | POA: Diagnosis not present

## 2017-08-13 DIAGNOSIS — M542 Cervicalgia: Secondary | ICD-10-CM | POA: Diagnosis not present

## 2017-08-13 DIAGNOSIS — M545 Low back pain: Secondary | ICD-10-CM | POA: Diagnosis not present

## 2017-08-13 DIAGNOSIS — M5136 Other intervertebral disc degeneration, lumbar region: Secondary | ICD-10-CM | POA: Diagnosis not present

## 2017-08-13 DIAGNOSIS — M9903 Segmental and somatic dysfunction of lumbar region: Secondary | ICD-10-CM | POA: Diagnosis not present

## 2017-08-14 DIAGNOSIS — M542 Cervicalgia: Secondary | ICD-10-CM | POA: Diagnosis not present

## 2017-08-14 DIAGNOSIS — M9903 Segmental and somatic dysfunction of lumbar region: Secondary | ICD-10-CM | POA: Diagnosis not present

## 2017-08-14 DIAGNOSIS — M545 Low back pain: Secondary | ICD-10-CM | POA: Diagnosis not present

## 2017-08-14 DIAGNOSIS — M5136 Other intervertebral disc degeneration, lumbar region: Secondary | ICD-10-CM | POA: Diagnosis not present

## 2017-08-16 DIAGNOSIS — R51 Headache: Secondary | ICD-10-CM | POA: Diagnosis not present

## 2017-08-16 DIAGNOSIS — M545 Low back pain: Secondary | ICD-10-CM | POA: Diagnosis not present

## 2017-08-16 DIAGNOSIS — R251 Tremor, unspecified: Secondary | ICD-10-CM | POA: Diagnosis not present

## 2017-08-19 ENCOUNTER — Other Ambulatory Visit: Payer: Self-pay | Admitting: Internal Medicine

## 2017-08-19 DIAGNOSIS — R519 Headache, unspecified: Secondary | ICD-10-CM

## 2017-08-19 DIAGNOSIS — R251 Tremor, unspecified: Secondary | ICD-10-CM

## 2017-08-19 DIAGNOSIS — R51 Headache: Principal | ICD-10-CM

## 2017-08-20 DIAGNOSIS — E119 Type 2 diabetes mellitus without complications: Secondary | ICD-10-CM | POA: Diagnosis not present

## 2017-08-20 DIAGNOSIS — I10 Essential (primary) hypertension: Secondary | ICD-10-CM | POA: Diagnosis not present

## 2017-08-21 ENCOUNTER — Encounter: Payer: Self-pay | Admitting: Neurology

## 2017-08-26 DIAGNOSIS — E119 Type 2 diabetes mellitus without complications: Secondary | ICD-10-CM | POA: Diagnosis not present

## 2017-08-29 DIAGNOSIS — H2181 Floppy iris syndrome: Secondary | ICD-10-CM | POA: Diagnosis not present

## 2017-08-29 DIAGNOSIS — H25811 Combined forms of age-related cataract, right eye: Secondary | ICD-10-CM | POA: Diagnosis not present

## 2017-08-29 DIAGNOSIS — H21561 Pupillary abnormality, right eye: Secondary | ICD-10-CM | POA: Diagnosis not present

## 2017-08-29 DIAGNOSIS — H2511 Age-related nuclear cataract, right eye: Secondary | ICD-10-CM | POA: Diagnosis not present

## 2017-09-02 ENCOUNTER — Other Ambulatory Visit: Payer: Medicare Other

## 2017-09-09 DIAGNOSIS — M545 Low back pain: Secondary | ICD-10-CM | POA: Diagnosis not present

## 2017-09-09 DIAGNOSIS — M5136 Other intervertebral disc degeneration, lumbar region: Secondary | ICD-10-CM | POA: Diagnosis not present

## 2017-09-09 DIAGNOSIS — M542 Cervicalgia: Secondary | ICD-10-CM | POA: Diagnosis not present

## 2017-09-09 DIAGNOSIS — M9903 Segmental and somatic dysfunction of lumbar region: Secondary | ICD-10-CM | POA: Diagnosis not present

## 2017-09-11 DIAGNOSIS — M542 Cervicalgia: Secondary | ICD-10-CM | POA: Diagnosis not present

## 2017-09-11 DIAGNOSIS — M545 Low back pain: Secondary | ICD-10-CM | POA: Diagnosis not present

## 2017-09-11 DIAGNOSIS — M9903 Segmental and somatic dysfunction of lumbar region: Secondary | ICD-10-CM | POA: Diagnosis not present

## 2017-09-11 DIAGNOSIS — M5136 Other intervertebral disc degeneration, lumbar region: Secondary | ICD-10-CM | POA: Diagnosis not present

## 2017-09-25 DIAGNOSIS — E119 Type 2 diabetes mellitus without complications: Secondary | ICD-10-CM | POA: Diagnosis not present

## 2017-09-27 NOTE — Progress Notes (Addendum)
Antonio Douglas was seen today in the movement disorders clinic for neurologic consultation at the request of Pearson Grippe, MD.  This patient is accompanied in the office by his wife and daughter who supplements the history.  The consultation is for the evaluation of tremor and to r/o PD.  The records that were made available to me were reviewed.   Specific Symptoms:  Tremor: Yes.  , both hands intermittenly; usually when trying to do something, holding a cup.  Wife states that she noted it when he was 76 years old.  Noted it in the thumb.  Drinks decaf coffee and 12 oz soda per day.  Doesn't think that it changes tremor.  Drinks 2 glasses wine few days a week.  He doesn't think that it changes tremor.  Stress will pick up the tremor Family hx of similar:  Yes.  , maternal uncles with tremor; one maternal uncle with PD Voice: no change Sleep: gets up frequently to use the bathroom and gets right back to sleep  Vivid Dreams:  No.  Acting out dreams:  No. (but wife states rarely will talk) Wet Pillows: No. Postural symptoms:  Yes.   - about 6 months the lingo is off  Falls?  Yes.  , when going up a step he has tripped a few times (5 times over 2 years) Bradykinesia symptoms: difficulty getting out of a chair Loss of smell:  No. Loss of taste:  No. Urinary Incontinence:  No. but has urgency and frequency Difficulty Swallowing:  No. Handwriting, micrographia: No., but is a little more difficult Trouble with ADL's:  No.  Trouble buttoning clothing: No. Depression:  No. Memory changes:  Yes.   - short term; able to remember to take meds; drives without trouble Hallucinations:  No. (will rarely wake up in the middle of the night and thinks that he sees person)  visual distortions: Yes.   N/V:  No. Lightheaded:  No.  Syncope: No. Diplopia:  No. Dyskinesia:  No.  Neuroimaging of the brain has not previously been performed.  It was ordered but patient cancelled the appt last month.  PREVIOUS  MEDICATIONS: none to date  ALLERGIES:  No Known Allergies  CURRENT MEDICATIONS:  Outpatient Encounter Medications as of 10/01/2017  Medication Sig  . aspirin EC 81 MG tablet Take 81 mg by mouth every evening.  Marland Kitchen atorvastatin (LIPITOR) 40 MG tablet TK 1 T PO QD  . glimepiride (AMARYL) 2 MG tablet TK 1 T PO QD WITH BRE OR THE FIRST MAIN MEAL OF THE DAY  . lisinopril (PRINIVIL,ZESTRIL) 5 MG tablet Take 5 mg by mouth every morning.  . loratadine (CLARITIN) 10 MG tablet Take 10 mg by mouth daily as needed for allergies.  . metFORMIN (GLUCOPHAGE) 1000 MG tablet Take 1,000 mg by mouth 2 (two) times daily.  . pioglitazone (ACTOS) 30 MG tablet Take 30 mg by mouth every evening.  . sodium chloride (OCEAN) 0.65 % SOLN nasal spray Place 1 spray into both nostrils daily as needed for congestion.  . tamsulosin (FLOMAX) 0.4 MG CAPS capsule Take 0.4 mg by mouth every morning.   No facility-administered encounter medications on file as of 10/01/2017.     PAST MEDICAL HISTORY:   Past Medical History:  Diagnosis Date  . BPH (benign prostatic hyperplasia)    mild  . Diabetes mellitus without complication (HCC)   . Heart murmur   . Hypertension   . Pneumothorax    spontaneous  PAST SURGICAL HISTORY:   Past Surgical History:  Procedure Laterality Date  . CATARACT EXTRACTION, BILATERAL    . colonscopy  2016   dr Kinnie Scalesmedoff  . INGUINAL HERNIA REPAIR Bilateral 01/17/2016   Procedure: LAPAROSCOPIC BILATERAL INGUINAL HERNIA REPAIR;  Surgeon: Ovidio Kinavid Newman, MD;  Location: [facility];  Service: General;  Laterality: Bilateral;  With MESH    SOCIAL HISTORY:   Social History   Socioeconomic History  . Marital status: Married    Spouse name: Not on file  . Number of children: Not on file  . Years of education: Not on file  . Highest education level: Not on file  Social Needs  . Financial resource strain: Not on file  . Food insecurity - worry: Not on file  . Food insecurity - inability: Not on file  .  Transportation needs - medical: Not on file  . Transportation needs - non-medical: Not on file  Occupational History  . 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 Smoking status: Former Smoker    Packs/day: 1.00    Years: 20.00    Pack years: 20.00    Types: Cigarettes    Last attempt to quit: 10/16/1983    Years since quitting: 33.9  . Smokeless tobacco: Never Used  Substance and Sexual Activity  . Alcohol use: Yes    Comment: wine 2-3 days per week  . Drug use: No  . Sexual activity: Not on file  Other Topics Concern  . Not on file  Social History Narrative  . Not on file    FAMILY HISTORY:   Family Status  Relation Name Status  . Mother  Deceased  . Father  Deceased  . Daughter  Alive  . Son x3 Alive    ROS:  A complete 10 system review of systems was obtained and was unremarkable apart from what is mentioned above.  PHYSICAL EXAMINATION:    VITALS:   Vitals:   10/01/17 0835  BP: 132/70  Pulse: 72  SpO2: 96%  Weight: 227 lb (103 kg)  Height: 6' (1.829 m)    GEN:  The patient appears stated age and is in NAD. HEENT:  Normocephalic, atraumatic.  The mucous membranes are moist. The superficial temporal arteries are without ropiness or tenderness. CV:  RRR Lungs:  CTAB Neck/HEME:  There are no carotid bruits bilaterally.  Neurological examination:  Orientation: The patient is alert and oriented x3. Fund of knowledge is appropriate.  Recent and remote memory are intact.  Attention and concentration are normal.    Able to name objects and repeat phrases. Cranial nerves: There is good facial symmetry. Pupils are equal round and reactive to light bilaterally. Fundoscopic exam reveals clear margins bilaterally. Extraocular muscles are intact. The visual fields are full to confrontational testing. The speech is fluent and clear. Soft palate rises symmetrically and there is no tongue deviation. Hearing is intact to conversational tone. Sensation: Sensation  is intact to light and pinprick throughout (facial, trunk, extremities). Vibration is absent at the bilateral big toe but intact at the knee (decreased at ankle). There is no extinction with double simultaneous stimulation. There is no sensory dermatomal level identified. Motor: Strength is 5/5 in the bilateral upper and lower extremities.   Shoulder shrug is equal and symmetric.  There is no pronator drift. Deep tendon reflexes: Deep tendon reflexes are 2/4 at the bilateral biceps, triceps, brachioradialis, patella and achilles. Plantar responses are downgoing bilaterally.  Movement examination: Tone: There  is normal tone in the bilateral upper extremities.  The tone in the lower extremities is normal.  Abnormal movements: There is mod resting tremor of the bilateral UE, only when distracted.  Otherwise there is no resting tremor.  There is more mild postural tremor that slightly increases with a weight.  he has difficulty with archimedes spirals.  he has difficulty when asked to pour a full glass of water from one glass to another especially when the glass is in the right hand.  There is chin tremor. Coordination:  There is no decremation with RAM's, with any form of RAMS, including alternating supination and pronation of the forearm, hand opening and closing, finger taps, heel taps and toe taps. Gait and Station: The patient has minimal difficulty arising out of a deep-seated chair without the use of the hands. The patient's stride length is normal.  The patient has a negative pull test.      Addendum labs:  Labs received from primary care physician dated August 20, 2017.  Sodium was 141, potassium 4.6, chloride 100, CO2 28, BUN 17, creatinine 0.99, AST 17, ALT 17, alkaline phosphatase 65.  Hemoglobin A1c was 7.7.  On May 20, 2017, the patient's B12 was 439 whereas on Feb 18, 2017 it was 56274 and on November 21, 2016 it was 215.  ASSESSMENT/PLAN:  1.  Essential Tremor.  -This is evidenced by the  symmetrical nature and longstanding hx of gradually getting worse.  We discussed nature and pathophysiology.  We discussed that this can continue to gradually get worse with time.  We discussed that some medications can worsen this, as can caffeine use.  We discussed medication therapy as well as surgical therapy.  Ultimately, the patient decided to start primidone.  Discussed that this likely wont completely alleviate tremor but may help decrease it.    -will try to get PCP labs  2.  Peripheral neuropathy  -The patient has clinical examination evidence of a diffuse peripheral neuropathy, which certainly can affect gait and balance.  This is likely due to DM.  He states that another med was added for his DM recently and he thinks that next A1C will be better.  We discussed safety associated with peripheral neuropathy.    3.  Follow up is anticipated in the next few months, sooner should new neurologic issues arise.  Much greater than 50% of this visit was spent in counseling and coordinating care.  Total face to face time:  60 min    Cc:  Pearson GrippeKim, Jerrie, MD

## 2017-10-01 ENCOUNTER — Telehealth: Payer: Self-pay | Admitting: Neurology

## 2017-10-01 ENCOUNTER — Encounter: Payer: Self-pay | Admitting: Neurology

## 2017-10-01 ENCOUNTER — Ambulatory Visit: Payer: Medicare Other | Admitting: Neurology

## 2017-10-01 VITALS — BP 132/70 | HR 72 | Ht 72.0 in | Wt 227.0 lb

## 2017-10-01 DIAGNOSIS — E1142 Type 2 diabetes mellitus with diabetic polyneuropathy: Secondary | ICD-10-CM | POA: Diagnosis not present

## 2017-10-01 DIAGNOSIS — G250 Essential tremor: Secondary | ICD-10-CM

## 2017-10-01 MED ORDER — PRIMIDONE 50 MG PO TABS: 50.0000 mg | ORAL_TABLET | Freq: Two times a day (BID) | ORAL | 1 refills | Status: DC

## 2017-10-01 NOTE — Patient Instructions (Signed)
Start primidone - 50 mg - 1/2 tablet at night for 4 nights.  Then increase to 1 tablet at night.  If not helpful in 4 weeks (one month), you can increase to 1 tablet in the AM and 1 at night.    Merry Christmas!

## 2017-10-01 NOTE — Telephone Encounter (Signed)
Explained to patient he should be on one tablet daily for four weeks, but could increase afterward. He will refer to instructions on AVS and call back with any further questions.

## 2017-10-01 NOTE — Telephone Encounter (Signed)
Patient was told to take 1 tablet of the Primidone 1 x a day but the instructions on medication say 1 tablet 2 x a day? He would like to speak with you to verify. Thanks

## 2017-10-02 ENCOUNTER — Telehealth: Payer: Self-pay | Admitting: Neurology

## 2017-10-02 NOTE — Telephone Encounter (Signed)
Left message on machine for patient to call back.

## 2017-10-02 NOTE — Telephone Encounter (Signed)
Let pt know that I got labs from PCP.  Hasn't had TSH.  Probably should order one.

## 2017-10-11 DIAGNOSIS — N401 Enlarged prostate with lower urinary tract symptoms: Secondary | ICD-10-CM | POA: Diagnosis not present

## 2017-10-11 DIAGNOSIS — R3915 Urgency of urination: Secondary | ICD-10-CM | POA: Diagnosis not present

## 2017-10-11 DIAGNOSIS — R351 Nocturia: Secondary | ICD-10-CM | POA: Diagnosis not present

## 2017-10-11 DIAGNOSIS — R972 Elevated prostate specific antigen [PSA]: Secondary | ICD-10-CM | POA: Diagnosis not present

## 2017-11-05 DIAGNOSIS — M542 Cervicalgia: Secondary | ICD-10-CM | POA: Diagnosis not present

## 2017-11-05 DIAGNOSIS — M5136 Other intervertebral disc degeneration, lumbar region: Secondary | ICD-10-CM | POA: Diagnosis not present

## 2017-11-05 DIAGNOSIS — M545 Low back pain: Secondary | ICD-10-CM | POA: Diagnosis not present

## 2017-11-05 DIAGNOSIS — M9903 Segmental and somatic dysfunction of lumbar region: Secondary | ICD-10-CM | POA: Diagnosis not present

## 2017-11-07 DIAGNOSIS — M542 Cervicalgia: Secondary | ICD-10-CM | POA: Diagnosis not present

## 2017-11-07 DIAGNOSIS — M5136 Other intervertebral disc degeneration, lumbar region: Secondary | ICD-10-CM | POA: Diagnosis not present

## 2017-11-07 DIAGNOSIS — M545 Low back pain: Secondary | ICD-10-CM | POA: Diagnosis not present

## 2017-11-07 DIAGNOSIS — M9903 Segmental and somatic dysfunction of lumbar region: Secondary | ICD-10-CM | POA: Diagnosis not present

## 2017-11-25 DIAGNOSIS — M9903 Segmental and somatic dysfunction of lumbar region: Secondary | ICD-10-CM | POA: Diagnosis not present

## 2017-11-25 DIAGNOSIS — M5136 Other intervertebral disc degeneration, lumbar region: Secondary | ICD-10-CM | POA: Diagnosis not present

## 2017-11-25 DIAGNOSIS — M545 Low back pain: Secondary | ICD-10-CM | POA: Diagnosis not present

## 2017-11-25 DIAGNOSIS — M542 Cervicalgia: Secondary | ICD-10-CM | POA: Diagnosis not present

## 2017-11-27 DIAGNOSIS — E119 Type 2 diabetes mellitus without complications: Secondary | ICD-10-CM | POA: Diagnosis not present

## 2017-11-28 DIAGNOSIS — M542 Cervicalgia: Secondary | ICD-10-CM | POA: Diagnosis not present

## 2017-11-28 DIAGNOSIS — M5136 Other intervertebral disc degeneration, lumbar region: Secondary | ICD-10-CM | POA: Diagnosis not present

## 2017-11-28 DIAGNOSIS — M545 Low back pain: Secondary | ICD-10-CM | POA: Diagnosis not present

## 2017-11-28 DIAGNOSIS — M9903 Segmental and somatic dysfunction of lumbar region: Secondary | ICD-10-CM | POA: Diagnosis not present

## 2017-12-03 DIAGNOSIS — I10 Essential (primary) hypertension: Secondary | ICD-10-CM | POA: Diagnosis not present

## 2017-12-03 DIAGNOSIS — L0390 Cellulitis, unspecified: Secondary | ICD-10-CM | POA: Diagnosis not present

## 2017-12-03 DIAGNOSIS — E7800 Pure hypercholesterolemia, unspecified: Secondary | ICD-10-CM | POA: Diagnosis not present

## 2017-12-03 DIAGNOSIS — E119 Type 2 diabetes mellitus without complications: Secondary | ICD-10-CM | POA: Diagnosis not present

## 2017-12-04 ENCOUNTER — Ambulatory Visit (INDEPENDENT_AMBULATORY_CARE_PROVIDER_SITE_OTHER): Payer: Medicare Other | Admitting: Podiatry

## 2017-12-04 ENCOUNTER — Other Ambulatory Visit: Payer: Self-pay | Admitting: Podiatry

## 2017-12-04 ENCOUNTER — Telehealth: Payer: Self-pay

## 2017-12-04 ENCOUNTER — Ambulatory Visit (INDEPENDENT_AMBULATORY_CARE_PROVIDER_SITE_OTHER): Payer: Medicare Other

## 2017-12-04 ENCOUNTER — Telehealth: Payer: Self-pay | Admitting: Podiatry

## 2017-12-04 ENCOUNTER — Encounter: Payer: Self-pay | Admitting: Podiatry

## 2017-12-04 DIAGNOSIS — M79674 Pain in right toe(s): Secondary | ICD-10-CM

## 2017-12-04 DIAGNOSIS — L03031 Cellulitis of right toe: Secondary | ICD-10-CM | POA: Diagnosis not present

## 2017-12-04 NOTE — Patient Instructions (Signed)

## 2017-12-04 NOTE — Telephone Encounter (Signed)
LVM informing patient to use neosporin or its equivalent when changing his bandages twice daily.

## 2017-12-04 NOTE — Telephone Encounter (Signed)
I just saw Dr. Charlsie Merlesegal at 9:45 am this morning for a small procedure on my right great toe. My question is should there be any ointment or anything like that to the little procedure area? There is no mention of that leaving the facility but on the instructions it mentioned applying salve to the area. Please call me back at [phone]. Thank you.

## 2017-12-04 NOTE — Progress Notes (Signed)
Subjective:   Patient ID: Antonio Douglas, male   DOB: 77 y.o.   MRN: [id number]   HPI Patient presents stating he is been very inflamed on his right big toe and it was throbbing last night and he has been soaking it.  Does not remember specific injury   ROS      Objective:  Physical Exam  Neurovascular status intact with redness of the right hallux medial side localized in nature with no proximal edema erythema or drainage noted     Assessment:  Paronychia infection right hallux medial side localized in nature     Plan:  H&P condition reviewed and he will continue the antibiotic that he is taking.  I went ahead and infiltrated the right hallux 60 mg like Marcaine mixture and using sterile instrumentation after sterile prep of the toe I removed the medial border did not note any acute drainage and flush the area out and allow channel for drainage to occur and applied sterile dressing.  Begin soaks and reappoint if the redness or symptoms were to persist

## 2018-01-09 DIAGNOSIS — J0110 Acute frontal sinusitis, unspecified: Secondary | ICD-10-CM | POA: Diagnosis not present

## 2018-01-09 DIAGNOSIS — J301 Allergic rhinitis due to pollen: Secondary | ICD-10-CM | POA: Diagnosis not present

## 2018-01-29 NOTE — Progress Notes (Signed)
Antonio Douglas was seen today in the movement disorders clinic for neurologic consultation at the request of Pearson Grippe, MD.  This patient is accompanied in the office by his wife and daughter who supplements the history.  The consultation is for the evaluation of tremor and to r/o PD.  The records that were made available to me were reviewed.  02/04/18 update: Patient is seen today in follow-up. This patient is accompanied in the office by his spouse who supplements the history. Patient was started on primidone last visit.  He is now on primidone, 50 mg twice per day.  Reports that he is "marginally better" and "definitely not worse."  No SE with the medication.   I did review lab work since our last visit.  He has not had a TSH done and we tried to call him back to get this completed, but he did not call back once we left a message.  PREVIOUS MEDICATIONS: none to date  ALLERGIES:  No Known Allergies  CURRENT MEDICATIONS:  Outpatient Encounter Medications as of 02/04/2018  Medication Sig  . aspirin EC 81 MG tablet Take 81 mg by mouth every evening.  Marland Kitchen atorvastatin (LIPITOR) 40 MG tablet TK 1 T PO QD  . glimepiride (AMARYL) 2 MG tablet TK 1 T PO QD WITH BRE OR THE FIRST MAIN MEAL OF THE DAY  . lisinopril (PRINIVIL,ZESTRIL) 5 MG tablet Take 5 mg by mouth every morning.  . loratadine (CLARITIN) 10 MG tablet Take 10 mg by mouth daily as needed for allergies.  . metFORMIN (GLUCOPHAGE) 1000 MG tablet Take 1,000 mg by mouth 2 (two) times daily.  . pioglitazone (ACTOS) 30 MG tablet Take 30 mg by mouth every evening.  . primidone (MYSOLINE) 50 MG tablet Take 1 tablet (50 mg total) by mouth 2 (two) times daily.  . sodium chloride (OCEAN) 0.65 % SOLN nasal spray Place 1 spray into both nostrils daily as needed for congestion.  . tamsulosin (FLOMAX) 0.4 MG CAPS capsule Take 0.4 mg by mouth every morning.   No facility-administered encounter medications on file as of 02/04/2018.     PAST MEDICAL  HISTORY:   Past Medical History:  Diagnosis Date  . BPH (benign prostatic hyperplasia)    mild  . Diabetes mellitus without complication (HCC)   . Heart murmur   . Hypertension   . Pneumothorax    spontaneous    PAST SURGICAL HISTORY:   Past Surgical History:  Procedure Laterality Date  . CATARACT EXTRACTION, BILATERAL    . colonscopy  2016   dr Kinnie Scales  . INGUINAL HERNIA REPAIR Bilateral 01/17/2016   Procedure: LAPAROSCOPIC BILATERAL INGUINAL HERNIA REPAIR;  Surgeon: Ovidio Kin, MD;  Location: [facility];  Service: General;  Laterality: Bilateral;  With MESH    SOCIAL HISTORY:   Social History   Socioeconomic History  . Marital status: Married    Spouse name: Not on file  . Number of children: Not on file  . Years of education: Not on file  . Highest education level: Not on file  Occupational History  . Occupation: retired    Comment: Chief Strategy Officer  . Financial resource strain: Not on file  . Food insecurity:    Worry: Not on file    Inability: Not on file  . Transportation needs:    Medical: Not on file    Non-medical: Not on file  Tobacco Use  . Smoking status: Former Smoker    Packs/day: 1.00  Years: 20.00    Pack years: 20.00    Types: Cigarettes    Last attempt to quit: 10/16/1983    Years since quitting: 34.3  . Smokeless tobacco: Never Used  Substance and Sexual Activity  . Alcohol use: Yes    Comment: wine 2-3 days per week  . Drug use: No  . Sexual activity: Not on file  Lifestyle  . Physical activity:    Days per week: Not on file    Minutes per session: Not on file  . Stress: Not on file  Relationships  . Social connections:    Talks on phone: Not on file    Gets together: Not on file    Attends religious service: Not on file    Active member of club or organization: Not on file    Attends meetings of clubs or organizations: Not on file    Relationship status: Not on file  . Intimate partner violence:    Fear of current or ex partner:  Not on file    Emotionally abused: Not on file    Physically abused: Not on file    Forced sexual activity: Not on file  Other Topics Concern  . Not on file  Social History Narrative  . Not on file    FAMILY HISTORY:   Family Status  Relation Name Status  . Mother  Deceased  . Father  Deceased  . Daughter  Alive  . Son x3 Alive    ROS:  A complete 10 system review of systems was obtained and was unremarkable apart from what is mentioned above.  PHYSICAL EXAMINATION:    VITALS:   Vitals:   02/04/18 1104  BP: 122/68  Pulse: (!) 107  SpO2: 94%  Weight: 227 lb (103 kg)  Height: 6' (1.829 m)    GEN:  The patient appears stated age and is in NAD. HEENT:  Normocephalic, atraumatic.  The mucous membranes are moist. The superficial temporal arteries are without ropiness or tenderness. CV:  Tachy.  regular Lungs:  CTAB Neck/HEME:  There are no carotid bruits bilaterally.  Neurological examination:  Orientation: The patient is A&O x 3 Cranial nerves: There is good facial symmetry.  Extraocular muscles are intact. The visual fields are full to confrontational testing. The speech is fluent and clear. Soft palate rises symmetrically and there is no tongue deviation. Hearing is intact to conversational tone. Sensation: Sensation is intact to light touch throughout Motor: Strength is 5/5 in the bilateral upper and lower extremities.   Shoulder shrug is equal and symmetric.  There is no pronator drift.   Movement examination: Tone: There is normal tone in the bilateral upper extremities.  The tone in the lower extremities is normal.  Abnormal movements: There is mod resting tremor of the bilateral UE, only when distracted.  Otherwise there is no resting tremor.  There is more mild postural tremor that slightly increases with a weight.  Archimedes spirals are slightly better than last visit.   Coordination:  There is no decremation with RAM's, with any form of RAMS, including  alternating supination and pronation of the forearm, hand opening and closing, finger taps, heel taps and toe taps. Gait and Station: The patient easily arises OOC without hands and walks well down the hall.  labs:  Labs received from primary care physician dated August 20, 2017.  Sodium was 141, potassium 4.6, chloride 100, CO2 28, BUN 17, creatinine 0.99, AST 17, ALT 17, alkaline phosphatase 65.  Hemoglobin  A1c was 7.7.  On May 20, 2017, the patient's B12 was 439 whereas on Feb 18, 2017 it was 70274 and on November 21, 2016 it was 215.  ASSESSMENT/PLAN:  1.  Essential Tremor.  -continue primidone, 50 mg bid.  He still has tremor but doesn't wish to increase it right now.  Has a rest component to tremor but no evidence of PD today  -check TSH  2.  Peripheral neuropathy  -The patient has clinical examination evidence of a diffuse peripheral neuropathy, which certainly can affect gait and balance.  This is likely due to DM.  He states that another med was added for his DM recently and he thinks that next A1C will be better.  We discussed safety associated with peripheral neuropathy.    3.  F/u 1 year, sooner should new neuro issues arise.      Cc:  Pearson GrippeKim, Arun, MD

## 2018-02-04 ENCOUNTER — Other Ambulatory Visit: Payer: Medicare Other

## 2018-02-04 ENCOUNTER — Ambulatory Visit: Payer: Medicare Other | Admitting: Neurology

## 2018-02-04 ENCOUNTER — Encounter: Payer: Self-pay | Admitting: Neurology

## 2018-02-04 ENCOUNTER — Other Ambulatory Visit: Payer: Self-pay

## 2018-02-04 VITALS — BP 122/68 | HR 107 | Ht 72.0 in | Wt 227.0 lb

## 2018-02-04 DIAGNOSIS — E1142 Type 2 diabetes mellitus with diabetic polyneuropathy: Secondary | ICD-10-CM

## 2018-02-04 DIAGNOSIS — G250 Essential tremor: Secondary | ICD-10-CM

## 2018-02-04 LAB — TSH: TSH: 1.33 m[IU]/L (ref 0.40–4.50)

## 2018-02-04 NOTE — Patient Instructions (Signed)
1. Your provider has requested that you have labwork completed today. Please go to [facility] ([id number]) on the second floor of this building before leaving the office today. You do not need to check in. If you are not called within 15 minutes please check with the front desk.   

## 2018-02-05 ENCOUNTER — Telehealth: Payer: Self-pay | Admitting: Neurology

## 2018-02-05 NOTE — Telephone Encounter (Signed)
Patient made aware labs okay. 

## 2018-02-05 NOTE — Telephone Encounter (Signed)
-----   Message from Octaviano Battyebecca S Tat, DO sent at 02/05/2018  7:10 AM EDT ----- I have reviewed all lab results which are normal or stable. Please inform the patient.

## 2018-02-13 DIAGNOSIS — M9903 Segmental and somatic dysfunction of lumbar region: Secondary | ICD-10-CM | POA: Diagnosis not present

## 2018-02-13 DIAGNOSIS — M542 Cervicalgia: Secondary | ICD-10-CM | POA: Diagnosis not present

## 2018-02-13 DIAGNOSIS — M5136 Other intervertebral disc degeneration, lumbar region: Secondary | ICD-10-CM | POA: Diagnosis not present

## 2018-02-13 DIAGNOSIS — M545 Low back pain: Secondary | ICD-10-CM | POA: Diagnosis not present

## 2018-03-24 ENCOUNTER — Other Ambulatory Visit: Payer: Self-pay | Admitting: Neurology

## 2018-03-24 MED ORDER — PRIMIDONE 50 MG PO TABS: 50.0000 mg | ORAL_TABLET | Freq: Two times a day (BID) | ORAL | 1 refills | Status: DC

## 2018-04-22 DIAGNOSIS — E119 Type 2 diabetes mellitus without complications: Secondary | ICD-10-CM | POA: Diagnosis not present

## 2018-04-25 DIAGNOSIS — N401 Enlarged prostate with lower urinary tract symptoms: Secondary | ICD-10-CM | POA: Diagnosis not present

## 2018-04-25 DIAGNOSIS — R351 Nocturia: Secondary | ICD-10-CM | POA: Diagnosis not present

## 2018-04-25 DIAGNOSIS — N138 Other obstructive and reflux uropathy: Secondary | ICD-10-CM | POA: Diagnosis not present

## 2018-04-25 DIAGNOSIS — R972 Elevated prostate specific antigen [PSA]: Secondary | ICD-10-CM | POA: Diagnosis not present

## 2018-04-29 DIAGNOSIS — D2272 Melanocytic nevi of left lower limb, including hip: Secondary | ICD-10-CM | POA: Diagnosis not present

## 2018-04-29 DIAGNOSIS — E119 Type 2 diabetes mellitus without complications: Secondary | ICD-10-CM | POA: Diagnosis not present

## 2018-04-29 DIAGNOSIS — L570 Actinic keratosis: Secondary | ICD-10-CM | POA: Diagnosis not present

## 2018-04-29 DIAGNOSIS — D225 Melanocytic nevi of trunk: Secondary | ICD-10-CM | POA: Diagnosis not present

## 2018-04-29 DIAGNOSIS — L820 Inflamed seborrheic keratosis: Secondary | ICD-10-CM | POA: Diagnosis not present

## 2018-04-29 DIAGNOSIS — X32XXXA Exposure to sunlight, initial encounter: Secondary | ICD-10-CM | POA: Diagnosis not present

## 2018-05-27 DIAGNOSIS — E119 Type 2 diabetes mellitus without complications: Secondary | ICD-10-CM | POA: Diagnosis not present

## 2018-05-27 DIAGNOSIS — I10 Essential (primary) hypertension: Secondary | ICD-10-CM | POA: Diagnosis not present

## 2018-05-27 DIAGNOSIS — Z125 Encounter for screening for malignant neoplasm of prostate: Secondary | ICD-10-CM | POA: Diagnosis not present

## 2018-06-03 DIAGNOSIS — I10 Essential (primary) hypertension: Secondary | ICD-10-CM | POA: Diagnosis not present

## 2018-06-03 DIAGNOSIS — Z125 Encounter for screening for malignant neoplasm of prostate: Secondary | ICD-10-CM | POA: Diagnosis not present

## 2018-06-03 DIAGNOSIS — E119 Type 2 diabetes mellitus without complications: Secondary | ICD-10-CM | POA: Diagnosis not present

## 2018-06-03 DIAGNOSIS — E7800 Pure hypercholesterolemia, unspecified: Secondary | ICD-10-CM | POA: Diagnosis not present

## 2018-07-11 DIAGNOSIS — Z23 Encounter for immunization: Secondary | ICD-10-CM | POA: Diagnosis not present

## 2018-08-05 DIAGNOSIS — D485 Neoplasm of uncertain behavior of skin: Secondary | ICD-10-CM | POA: Diagnosis not present

## 2018-08-27 DIAGNOSIS — R972 Elevated prostate specific antigen [PSA]: Secondary | ICD-10-CM | POA: Diagnosis not present

## 2018-09-24 DIAGNOSIS — M542 Cervicalgia: Secondary | ICD-10-CM | POA: Diagnosis not present

## 2018-09-24 DIAGNOSIS — M5136 Other intervertebral disc degeneration, lumbar region: Secondary | ICD-10-CM | POA: Diagnosis not present

## 2018-09-24 DIAGNOSIS — M9903 Segmental and somatic dysfunction of lumbar region: Secondary | ICD-10-CM | POA: Diagnosis not present

## 2018-09-24 DIAGNOSIS — M545 Low back pain: Secondary | ICD-10-CM | POA: Diagnosis not present

## 2018-09-25 DIAGNOSIS — M5136 Other intervertebral disc degeneration, lumbar region: Secondary | ICD-10-CM | POA: Diagnosis not present

## 2018-09-25 DIAGNOSIS — M545 Low back pain: Secondary | ICD-10-CM | POA: Diagnosis not present

## 2018-09-25 DIAGNOSIS — M9903 Segmental and somatic dysfunction of lumbar region: Secondary | ICD-10-CM | POA: Diagnosis not present

## 2018-09-25 DIAGNOSIS — M542 Cervicalgia: Secondary | ICD-10-CM | POA: Diagnosis not present

## 2018-09-29 DIAGNOSIS — M542 Cervicalgia: Secondary | ICD-10-CM | POA: Diagnosis not present

## 2018-09-29 DIAGNOSIS — M545 Low back pain: Secondary | ICD-10-CM | POA: Diagnosis not present

## 2018-09-29 DIAGNOSIS — M5136 Other intervertebral disc degeneration, lumbar region: Secondary | ICD-10-CM | POA: Diagnosis not present

## 2018-09-29 DIAGNOSIS — M9903 Segmental and somatic dysfunction of lumbar region: Secondary | ICD-10-CM | POA: Diagnosis not present

## 2018-10-02 DIAGNOSIS — M9903 Segmental and somatic dysfunction of lumbar region: Secondary | ICD-10-CM | POA: Diagnosis not present

## 2018-10-02 DIAGNOSIS — M542 Cervicalgia: Secondary | ICD-10-CM | POA: Diagnosis not present

## 2018-10-02 DIAGNOSIS — M545 Low back pain: Secondary | ICD-10-CM | POA: Diagnosis not present

## 2018-10-02 DIAGNOSIS — M5136 Other intervertebral disc degeneration, lumbar region: Secondary | ICD-10-CM | POA: Diagnosis not present

## 2018-10-13 DIAGNOSIS — M5136 Other intervertebral disc degeneration, lumbar region: Secondary | ICD-10-CM | POA: Diagnosis not present

## 2018-10-13 DIAGNOSIS — M542 Cervicalgia: Secondary | ICD-10-CM | POA: Diagnosis not present

## 2018-10-13 DIAGNOSIS — M9903 Segmental and somatic dysfunction of lumbar region: Secondary | ICD-10-CM | POA: Diagnosis not present

## 2018-10-13 DIAGNOSIS — M545 Low back pain: Secondary | ICD-10-CM | POA: Diagnosis not present

## 2018-10-16 ENCOUNTER — Other Ambulatory Visit: Payer: Self-pay | Admitting: Neurology

## 2018-10-16 MED ORDER — PRIMIDONE 50 MG PO TABS: 50.0000 mg | ORAL_TABLET | Freq: Two times a day (BID) | ORAL | 1 refills | Status: DC

## 2018-10-27 DIAGNOSIS — N401 Enlarged prostate with lower urinary tract symptoms: Secondary | ICD-10-CM | POA: Diagnosis not present

## 2018-10-27 DIAGNOSIS — N138 Other obstructive and reflux uropathy: Secondary | ICD-10-CM | POA: Diagnosis not present

## 2018-10-27 DIAGNOSIS — R3915 Urgency of urination: Secondary | ICD-10-CM | POA: Diagnosis not present

## 2018-10-27 DIAGNOSIS — R972 Elevated prostate specific antigen [PSA]: Secondary | ICD-10-CM | POA: Diagnosis not present

## 2018-11-03 DIAGNOSIS — M5136 Other intervertebral disc degeneration, lumbar region: Secondary | ICD-10-CM | POA: Diagnosis not present

## 2018-11-03 DIAGNOSIS — M542 Cervicalgia: Secondary | ICD-10-CM | POA: Diagnosis not present

## 2018-11-03 DIAGNOSIS — M545 Low back pain: Secondary | ICD-10-CM | POA: Diagnosis not present

## 2018-11-03 DIAGNOSIS — M9903 Segmental and somatic dysfunction of lumbar region: Secondary | ICD-10-CM | POA: Diagnosis not present

## 2018-11-12 DIAGNOSIS — M542 Cervicalgia: Secondary | ICD-10-CM | POA: Diagnosis not present

## 2018-11-12 DIAGNOSIS — M5136 Other intervertebral disc degeneration, lumbar region: Secondary | ICD-10-CM | POA: Diagnosis not present

## 2018-11-12 DIAGNOSIS — M9903 Segmental and somatic dysfunction of lumbar region: Secondary | ICD-10-CM | POA: Diagnosis not present

## 2018-11-12 DIAGNOSIS — M545 Low back pain: Secondary | ICD-10-CM | POA: Diagnosis not present

## 2018-11-27 DIAGNOSIS — I10 Essential (primary) hypertension: Secondary | ICD-10-CM | POA: Diagnosis not present

## 2018-11-27 DIAGNOSIS — Z125 Encounter for screening for malignant neoplasm of prostate: Secondary | ICD-10-CM | POA: Diagnosis not present

## 2018-11-27 DIAGNOSIS — E119 Type 2 diabetes mellitus without complications: Secondary | ICD-10-CM | POA: Diagnosis not present

## 2018-11-27 DIAGNOSIS — Z0000 Encounter for general adult medical examination without abnormal findings: Secondary | ICD-10-CM | POA: Diagnosis not present

## 2018-11-27 DIAGNOSIS — E7800 Pure hypercholesterolemia, unspecified: Secondary | ICD-10-CM | POA: Diagnosis not present

## 2018-12-04 DIAGNOSIS — I10 Essential (primary) hypertension: Secondary | ICD-10-CM | POA: Diagnosis not present

## 2018-12-04 DIAGNOSIS — E7800 Pure hypercholesterolemia, unspecified: Secondary | ICD-10-CM | POA: Diagnosis not present

## 2018-12-04 DIAGNOSIS — E119 Type 2 diabetes mellitus without complications: Secondary | ICD-10-CM | POA: Diagnosis not present

## 2018-12-04 DIAGNOSIS — Z0000 Encounter for general adult medical examination without abnormal findings: Secondary | ICD-10-CM | POA: Diagnosis not present

## 2018-12-11 DIAGNOSIS — M542 Cervicalgia: Secondary | ICD-10-CM | POA: Diagnosis not present

## 2018-12-11 DIAGNOSIS — M545 Low back pain: Secondary | ICD-10-CM | POA: Diagnosis not present

## 2018-12-11 DIAGNOSIS — M9903 Segmental and somatic dysfunction of lumbar region: Secondary | ICD-10-CM | POA: Diagnosis not present

## 2018-12-11 DIAGNOSIS — M5136 Other intervertebral disc degeneration, lumbar region: Secondary | ICD-10-CM | POA: Diagnosis not present

## 2018-12-31 DIAGNOSIS — E119 Type 2 diabetes mellitus without complications: Secondary | ICD-10-CM | POA: Diagnosis not present

## 2019-01-08 DIAGNOSIS — M9903 Segmental and somatic dysfunction of lumbar region: Secondary | ICD-10-CM | POA: Diagnosis not present

## 2019-01-08 DIAGNOSIS — M545 Low back pain: Secondary | ICD-10-CM | POA: Diagnosis not present

## 2019-01-08 DIAGNOSIS — M542 Cervicalgia: Secondary | ICD-10-CM | POA: Diagnosis not present

## 2019-01-08 DIAGNOSIS — M5136 Other intervertebral disc degeneration, lumbar region: Secondary | ICD-10-CM | POA: Diagnosis not present

## 2019-01-12 DIAGNOSIS — M545 Low back pain: Secondary | ICD-10-CM | POA: Diagnosis not present

## 2019-01-12 DIAGNOSIS — M542 Cervicalgia: Secondary | ICD-10-CM | POA: Diagnosis not present

## 2019-01-12 DIAGNOSIS — M9903 Segmental and somatic dysfunction of lumbar region: Secondary | ICD-10-CM | POA: Diagnosis not present

## 2019-01-12 DIAGNOSIS — M5136 Other intervertebral disc degeneration, lumbar region: Secondary | ICD-10-CM | POA: Diagnosis not present

## 2019-01-30 NOTE — Progress Notes (Signed)
Virtual Visit via Video Note The purpose of this virtual visit is to provide medical care while limiting exposure to the novel coronavirus.    Consent was obtained for video visit:  Yes.   Answered questions that patient had about telehealth interaction:  Yes.   I discussed the limitations, risks, security and privacy concerns of performing an evaluation and management service by telemedicine. I also discussed with the patient that there may be a patient responsible charge related to this service. The patient expressed understanding and agreed to proceed.  Pt location: Home Physician Location: office Name of referring provider:  Pearson Grippe, MD I connected with Vella Raring at patients initiation/request on 02/02/2019 at 11:00 AM EDT by video enabled telemedicine application and verified that I am speaking with the correct person using two identifiers. Pt MRN:  [id number] Pt DOB:  October 15, 1941 Video Participants:  Vella Raring;  daughter   History of Present Illness:  Patient seen today in follow-up for essential tremor.  I last saw him about a year ago.  He is on primidone, 50 mg twice per day.  He reports that tremor has been about the same.  He denies any side effects with his medication.  He did have a fall a month ago.  He was outside and hadn't eaten much and had "a couple glasses of wine" and fell.   Had BW in mid feb.  His A1C was back to 6.0.  Daughter mentioned that he is "cold all of the time."  Pt states that thyroid was not tested.     Observations/Objective:   Vitals:   02/02/19 1058  BP: 126/70  Pulse: 74  Weight: 224 lb (101.6 kg)  Height: 6' (1.829 m)   GEN:  The patient appears stated age and is in NAD.  Neurological examination:  Orientation: The patient is alert and oriented x3. Cranial nerves: There is good facial symmetry. There is no facial hypomimia.  The speech is fluent and clear. Soft palate rises symmetrically and there is no tongue deviation. Hearing is  intact to conversational tone. Motor: Strength is at least antigravity x 4.   Shoulder shrug is equal and symmetric.  There is no pronator drift.  Movement examination: Tone: unable Abnormal movements: There is a moderate resting tremor of the bilateral upper extremities when distracted.  There is a postural tremor bilaterally. Coordination:  There is no decremation with RAM's, with any form of RAMS, including alternating supination and pronation of the forearm, hand opening and closing, finger taps, heel taps and toe taps. Gait and Station: The patient has no difficulty arising out of a deep-seated chair without the use of the hands. The patient's stride length is normal with good arm swing bilaterally    Assessment and Plan:   1.  Essential tremor  -Increase primidone, 50 mg, 2 in the AM, 1 at night.  He does have a resting component to tremor, but no other features of Parkinson's disease.  Rest component can happen to essential tremor after that has been present for quite some time and is moderate in severity.  -daughter asks about genetic components of ET and discussed that today  2.  Cold intolerance  -to f/u with PCP for w/u and possible thyroid testing.  Follow Up Instructions:    -I discussed the assessment and treatment plan with the patient. The patient was provided an opportunity to ask questions and all were answered. The patient agreed with the plan and demonstrated an  understanding of the instructions.   The patient was advised to call back or seek an in-person evaluation if the symptoms worsen or if the condition fails to improve as anticipated.    Total Time spent in visit with the patient was:  15 min, of which more than 50% of the time was spent in counseling and/or coordinating care on safety.   Pt understands and agrees with the plan of care outlined.     Kerin Salenebecca Marizol Borror, DO

## 2019-02-02 ENCOUNTER — Encounter: Payer: Self-pay | Admitting: Neurology

## 2019-02-02 ENCOUNTER — Other Ambulatory Visit: Payer: Self-pay

## 2019-02-02 ENCOUNTER — Encounter: Payer: Self-pay | Admitting: *Deleted

## 2019-02-02 ENCOUNTER — Telehealth (INDEPENDENT_AMBULATORY_CARE_PROVIDER_SITE_OTHER): Payer: Medicare Other | Admitting: Neurology

## 2019-02-02 DIAGNOSIS — G250 Essential tremor: Secondary | ICD-10-CM

## 2019-02-02 MED ORDER — PRIMIDONE 50 MG PO TABS: ORAL_TABLET | ORAL | 1 refills | Status: DC

## 2019-02-10 ENCOUNTER — Encounter

## 2019-02-10 ENCOUNTER — Ambulatory Visit: Payer: Medicare Other | Admitting: Neurology

## 2019-02-19 DIAGNOSIS — M5136 Other intervertebral disc degeneration, lumbar region: Secondary | ICD-10-CM | POA: Diagnosis not present

## 2019-02-19 DIAGNOSIS — M545 Low back pain: Secondary | ICD-10-CM | POA: Diagnosis not present

## 2019-02-19 DIAGNOSIS — M542 Cervicalgia: Secondary | ICD-10-CM | POA: Diagnosis not present

## 2019-02-19 DIAGNOSIS — M9903 Segmental and somatic dysfunction of lumbar region: Secondary | ICD-10-CM | POA: Diagnosis not present

## 2019-02-23 DIAGNOSIS — M542 Cervicalgia: Secondary | ICD-10-CM | POA: Diagnosis not present

## 2019-02-23 DIAGNOSIS — M5136 Other intervertebral disc degeneration, lumbar region: Secondary | ICD-10-CM | POA: Diagnosis not present

## 2019-02-23 DIAGNOSIS — M545 Low back pain: Secondary | ICD-10-CM | POA: Diagnosis not present

## 2019-02-23 DIAGNOSIS — M9903 Segmental and somatic dysfunction of lumbar region: Secondary | ICD-10-CM | POA: Diagnosis not present

## 2019-03-18 DIAGNOSIS — D225 Melanocytic nevi of trunk: Secondary | ICD-10-CM | POA: Diagnosis not present

## 2019-03-18 DIAGNOSIS — L570 Actinic keratosis: Secondary | ICD-10-CM | POA: Diagnosis not present

## 2019-03-18 DIAGNOSIS — X32XXXD Exposure to sunlight, subsequent encounter: Secondary | ICD-10-CM | POA: Diagnosis not present

## 2019-03-18 DIAGNOSIS — Z1283 Encounter for screening for malignant neoplasm of skin: Secondary | ICD-10-CM | POA: Diagnosis not present

## 2019-05-01 DIAGNOSIS — H52223 Regular astigmatism, bilateral: Secondary | ICD-10-CM | POA: Diagnosis not present

## 2019-05-28 DIAGNOSIS — E119 Type 2 diabetes mellitus without complications: Secondary | ICD-10-CM | POA: Diagnosis not present

## 2019-05-28 DIAGNOSIS — E7800 Pure hypercholesterolemia, unspecified: Secondary | ICD-10-CM | POA: Diagnosis not present

## 2019-05-28 DIAGNOSIS — E538 Deficiency of other specified B group vitamins: Secondary | ICD-10-CM | POA: Diagnosis not present

## 2019-05-29 DIAGNOSIS — X32XXXD Exposure to sunlight, subsequent encounter: Secondary | ICD-10-CM | POA: Diagnosis not present

## 2019-05-29 DIAGNOSIS — L821 Other seborrheic keratosis: Secondary | ICD-10-CM | POA: Diagnosis not present

## 2019-05-29 DIAGNOSIS — L570 Actinic keratosis: Secondary | ICD-10-CM | POA: Diagnosis not present

## 2019-06-03 DIAGNOSIS — M9903 Segmental and somatic dysfunction of lumbar region: Secondary | ICD-10-CM | POA: Diagnosis not present

## 2019-06-03 DIAGNOSIS — M545 Low back pain: Secondary | ICD-10-CM | POA: Diagnosis not present

## 2019-06-03 DIAGNOSIS — M542 Cervicalgia: Secondary | ICD-10-CM | POA: Diagnosis not present

## 2019-06-03 DIAGNOSIS — M5136 Other intervertebral disc degeneration, lumbar region: Secondary | ICD-10-CM | POA: Diagnosis not present

## 2019-06-04 DIAGNOSIS — M545 Low back pain: Secondary | ICD-10-CM | POA: Diagnosis not present

## 2019-06-04 DIAGNOSIS — M9903 Segmental and somatic dysfunction of lumbar region: Secondary | ICD-10-CM | POA: Diagnosis not present

## 2019-06-04 DIAGNOSIS — E119 Type 2 diabetes mellitus without complications: Secondary | ICD-10-CM | POA: Diagnosis not present

## 2019-06-04 DIAGNOSIS — E7800 Pure hypercholesterolemia, unspecified: Secondary | ICD-10-CM | POA: Diagnosis not present

## 2019-06-04 DIAGNOSIS — M542 Cervicalgia: Secondary | ICD-10-CM | POA: Diagnosis not present

## 2019-06-04 DIAGNOSIS — E538 Deficiency of other specified B group vitamins: Secondary | ICD-10-CM | POA: Diagnosis not present

## 2019-06-04 DIAGNOSIS — M5136 Other intervertebral disc degeneration, lumbar region: Secondary | ICD-10-CM | POA: Diagnosis not present

## 2019-06-04 DIAGNOSIS — I10 Essential (primary) hypertension: Secondary | ICD-10-CM | POA: Diagnosis not present

## 2019-06-08 DIAGNOSIS — M542 Cervicalgia: Secondary | ICD-10-CM | POA: Diagnosis not present

## 2019-06-08 DIAGNOSIS — M545 Low back pain: Secondary | ICD-10-CM | POA: Diagnosis not present

## 2019-06-08 DIAGNOSIS — M5136 Other intervertebral disc degeneration, lumbar region: Secondary | ICD-10-CM | POA: Diagnosis not present

## 2019-06-08 DIAGNOSIS — M9903 Segmental and somatic dysfunction of lumbar region: Secondary | ICD-10-CM | POA: Diagnosis not present

## 2019-06-15 DIAGNOSIS — M542 Cervicalgia: Secondary | ICD-10-CM | POA: Diagnosis not present

## 2019-06-15 DIAGNOSIS — M9903 Segmental and somatic dysfunction of lumbar region: Secondary | ICD-10-CM | POA: Diagnosis not present

## 2019-06-15 DIAGNOSIS — M545 Low back pain: Secondary | ICD-10-CM | POA: Diagnosis not present

## 2019-06-15 DIAGNOSIS — M5136 Other intervertebral disc degeneration, lumbar region: Secondary | ICD-10-CM | POA: Diagnosis not present

## 2019-06-17 ENCOUNTER — Telehealth: Payer: Self-pay | Admitting: Neurology

## 2019-06-17 NOTE — Telephone Encounter (Signed)
Patient returned a call to [place] about his virtual visit.

## 2019-06-17 NOTE — Telephone Encounter (Signed)
Called patient back triage completed for Friday appt

## 2019-06-18 NOTE — Progress Notes (Signed)
Virtual Visit via Video Note The purpose of this virtual visit is to provide medical care while limiting exposure to the novel coronavirus.    Consent was obtained for video visit:  Yes.   Answered questions that patient had about telehealth interaction:  Yes.   I discussed the limitations, risks, security and privacy concerns of performing an evaluation and management service by telemedicine. I also discussed with the patient that there may be a patient responsible charge related to this service. The patient expressed understanding and agreed to proceed.  Pt location: Home Physician Location: home Name of referring provider:  Pearson GrippeKim, Frederico, MD I connected with Antonio Douglas at patients initiation/request on 06/19/2019 at 11:15 AM EDT by video enabled telemedicine application and verified that I am speaking with the correct person using two identifiers. Pt MRN:  [id number] Pt DOB:  1940/10/31 Video Participants:  Antonio RaringJames W Noone;  daughter   History of Present Illness:  Patient seen today in follow-up for essential tremor.  He is on primidone, which was increased last visit so that he is taking 50 mg, 2 tablets in the morning and 1 at night.  Tremor has been stable.  No falls.  No lightheadedness or near syncope.  He is tolerating the medication well.    Observations/Objective:   Vitals:   06/19/19 0801  Weight: 223 lb (101.2 kg)  Height: 6\' 2"  (1.88 m)   GEN:  The patient appears stated age and is in NAD.  Neurological examination:  Orientation: The patient is alert and oriented x3. Cranial nerves: There is good facial symmetry. There is no facial hypomimia.  The speech is fluent and clear. Soft palate rises symmetrically and there is no tongue deviation. Hearing is intact to conversational tone. Motor: Strength is at least antigravity x 4.   Shoulder shrug is equal and symmetric.  There is no pronator drift.  Movement examination: Tone: unable Abnormal movements: There is right  greater than left upper extremity resting tremor.  He also has postural tremor. Coordination:  There is no decremation with RAM's, with any form of RAMS, including alternating supination and pronation of the forearm, hand opening and closing, finger taps, heel taps and toe taps. Gait and Station: The patient has no difficulty arising out of a deep-seated chair without the use of the hands. The patient's stride length is normal with good arm swing bilaterally    Assessment and Plan:   1.  Essential tremor  -Discussed several options today.  Discussed staying on current dose of primidone.  Discussed increasing the primidone.  Discussed cautiously adding propranolol.  Ultimately, the patient asked me if we could just increase the primidone so that he is taking 50 mg, 2 tablets twice per day.  I sent this to his pharmacy.  We discussed risk, benefits, and side effects.  -Does have a rest component to tremor, but suspect that this is just longstanding essential tremor.  Have not seen any progression to Parkinson's disease.  -Discussed that we may add propranolol next visit, but would like to have a better set of vitals before we decide to do that.  Follow Up Instructions:    -I discussed the assessment and treatment plan with the patient. The patient was provided an opportunity to ask questions and all were answered. The patient agreed with the plan and demonstrated an understanding of the instructions.   The patient was advised to call back or seek an in-person evaluation if the symptoms worsen or if  the condition fails to improve as anticipated.    Total Time spent in visit with the patient was:  15 min, of which more than 50% of the time was spent in counseling and/or coordinating care on safety.   Pt understands and agrees with the plan of care outlined.     Alonza Bogus, DO

## 2019-06-19 ENCOUNTER — Other Ambulatory Visit: Payer: Self-pay

## 2019-06-19 ENCOUNTER — Telehealth: Payer: Self-pay | Admitting: Neurology

## 2019-06-19 ENCOUNTER — Encounter: Payer: Self-pay | Admitting: Neurology

## 2019-06-19 ENCOUNTER — Telehealth (INDEPENDENT_AMBULATORY_CARE_PROVIDER_SITE_OTHER): Payer: Medicare Other | Admitting: Neurology

## 2019-06-19 VITALS — HR 68 | Ht 74.0 in | Wt 223.0 lb

## 2019-06-19 DIAGNOSIS — G250 Essential tremor: Secondary | ICD-10-CM | POA: Diagnosis not present

## 2019-06-19 MED ORDER — PRIMIDONE 50 MG PO TABS: 100.0000 mg | ORAL_TABLET | Freq: Two times a day (BID) | ORAL | 1 refills | Status: DC

## 2019-06-19 NOTE — Telephone Encounter (Signed)
[facility] left message with answering service and needs to talk to someone about the RX please call back

## 2019-06-19 NOTE — Telephone Encounter (Signed)
I personally called [facility] and clarified the instructions.  Spoke with [employer]

## 2019-07-02 ENCOUNTER — Ambulatory Visit: Payer: Medicare Other | Admitting: Neurology

## 2019-07-10 ENCOUNTER — Ambulatory Visit: Payer: Medicare Other | Admitting: Neurology

## 2019-07-16 DIAGNOSIS — M9903 Segmental and somatic dysfunction of lumbar region: Secondary | ICD-10-CM | POA: Diagnosis not present

## 2019-07-16 DIAGNOSIS — M545 Low back pain: Secondary | ICD-10-CM | POA: Diagnosis not present

## 2019-07-16 DIAGNOSIS — M542 Cervicalgia: Secondary | ICD-10-CM | POA: Diagnosis not present

## 2019-07-16 DIAGNOSIS — M5136 Other intervertebral disc degeneration, lumbar region: Secondary | ICD-10-CM | POA: Diagnosis not present

## 2019-07-23 ENCOUNTER — Other Ambulatory Visit: Payer: Self-pay

## 2019-07-23 DIAGNOSIS — Z20822 Contact with and (suspected) exposure to covid-19: Secondary | ICD-10-CM

## 2019-07-23 DIAGNOSIS — Z20828 Contact with and (suspected) exposure to other viral communicable diseases: Secondary | ICD-10-CM | POA: Diagnosis not present

## 2019-07-25 LAB — NOVEL CORONAVIRUS, NAA: SARS-CoV-2, NAA: NOT DETECTED

## 2019-07-31 ENCOUNTER — Ambulatory Visit: Payer: Medicare Other | Admitting: Neurology

## 2019-08-12 DIAGNOSIS — H6501 Acute serous otitis media, right ear: Secondary | ICD-10-CM | POA: Diagnosis not present

## 2019-09-25 DIAGNOSIS — E119 Type 2 diabetes mellitus without complications: Secondary | ICD-10-CM | POA: Diagnosis not present

## 2019-10-27 DIAGNOSIS — M9903 Segmental and somatic dysfunction of lumbar region: Secondary | ICD-10-CM | POA: Diagnosis not present

## 2019-10-27 DIAGNOSIS — M5136 Other intervertebral disc degeneration, lumbar region: Secondary | ICD-10-CM | POA: Diagnosis not present

## 2019-10-27 DIAGNOSIS — M542 Cervicalgia: Secondary | ICD-10-CM | POA: Diagnosis not present

## 2019-10-27 DIAGNOSIS — M545 Low back pain: Secondary | ICD-10-CM | POA: Diagnosis not present

## 2019-10-29 DIAGNOSIS — M545 Low back pain: Secondary | ICD-10-CM | POA: Diagnosis not present

## 2019-10-29 DIAGNOSIS — M9903 Segmental and somatic dysfunction of lumbar region: Secondary | ICD-10-CM | POA: Diagnosis not present

## 2019-10-29 DIAGNOSIS — M5136 Other intervertebral disc degeneration, lumbar region: Secondary | ICD-10-CM | POA: Diagnosis not present

## 2019-10-29 DIAGNOSIS — M542 Cervicalgia: Secondary | ICD-10-CM | POA: Diagnosis not present

## 2019-11-02 DIAGNOSIS — M9903 Segmental and somatic dysfunction of lumbar region: Secondary | ICD-10-CM | POA: Diagnosis not present

## 2019-11-02 DIAGNOSIS — M542 Cervicalgia: Secondary | ICD-10-CM | POA: Diagnosis not present

## 2019-11-02 DIAGNOSIS — M5136 Other intervertebral disc degeneration, lumbar region: Secondary | ICD-10-CM | POA: Diagnosis not present

## 2019-11-02 DIAGNOSIS — M545 Low back pain: Secondary | ICD-10-CM | POA: Diagnosis not present

## 2019-11-03 DIAGNOSIS — R972 Elevated prostate specific antigen [PSA]: Secondary | ICD-10-CM | POA: Diagnosis not present

## 2019-11-05 DIAGNOSIS — M9903 Segmental and somatic dysfunction of lumbar region: Secondary | ICD-10-CM | POA: Diagnosis not present

## 2019-11-05 DIAGNOSIS — M542 Cervicalgia: Secondary | ICD-10-CM | POA: Diagnosis not present

## 2019-11-05 DIAGNOSIS — M545 Low back pain: Secondary | ICD-10-CM | POA: Diagnosis not present

## 2019-11-05 DIAGNOSIS — M5136 Other intervertebral disc degeneration, lumbar region: Secondary | ICD-10-CM | POA: Diagnosis not present

## 2019-11-09 DIAGNOSIS — R351 Nocturia: Secondary | ICD-10-CM | POA: Diagnosis not present

## 2019-11-09 DIAGNOSIS — N401 Enlarged prostate with lower urinary tract symptoms: Secondary | ICD-10-CM | POA: Diagnosis not present

## 2019-11-09 DIAGNOSIS — R972 Elevated prostate specific antigen [PSA]: Secondary | ICD-10-CM | POA: Diagnosis not present

## 2019-11-09 DIAGNOSIS — N138 Other obstructive and reflux uropathy: Secondary | ICD-10-CM | POA: Diagnosis not present

## 2019-11-17 NOTE — Progress Notes (Signed)
Virtual Visit via Video Note The purpose of this virtual visit is to provide medical care while limiting exposure to the novel coronavirus.    Consent was obtained for video visit:  Yes.   Answered questions that patient had about telehealth interaction:  Yes.   I discussed the limitations, risks, security and privacy concerns of performing an evaluation and management service by telemedicine. I also discussed with the patient that there may be a patient responsible charge related to this service. The patient expressed understanding and agreed to proceed.  Pt location: Home Physician Location: home Name of referring provider:  Jani Gravel, MD I connected with Chauncey Cruel at patients initiation/request on 11/19/2019 at  3:30 PM EST by video enabled telemedicine application and verified that I am speaking with the correct person using two identifiers. Pt MRN:  [id number] Pt DOB:  08/08/1941 Video Participants:  Chauncey Cruel;  Daughter supplements hx, Freda Munro   History of Present Illness:  Patient seen today in follow-up for essential tremor.  Tremor has been stable.  My previous records as well as any outside records made available were reviewed prior to todays visit.  Patient did see urology just on January 25.  Those records are reviewed, including his vitals.  His pulse was 80.  Noted in the urologist records that the patient was on Requip, 0.25 mg, 1 to 2 tablets before bedtime.  Current movement d/o meds: Primidone, 50 mg, 2 tablets twice per day (increased last visit)    Current Outpatient Medications on File Prior to Visit  Medication Sig Dispense Refill  . aspirin EC 81 MG tablet Take 81 mg by mouth every evening.    Marland Kitchen atorvastatin (LIPITOR) 40 MG tablet TK 1 T PO QD  2  . Cyanocobalamin (VITAMIN B12 PO) Take by mouth.    Marland Kitchen glimepiride (AMARYL) 2 MG tablet TK 1 T PO QD WITH BRE OR THE FIRST MAIN MEAL OF THE DAY  12  . lisinopril (PRINIVIL,ZESTRIL) 5 MG tablet Take 5 mg by mouth  every morning.    . loratadine (CLARITIN) 10 MG tablet Take 10 mg by mouth daily as needed for allergies.    . metFORMIN (GLUCOPHAGE) 1000 MG tablet Take 1,000 mg by mouth 2 (two) times daily.  4  . pioglitazone (ACTOS) 30 MG tablet Take 30 mg by mouth every evening.  4  . primidone (MYSOLINE) 50 MG tablet Take 2 tablets (100 mg total) by mouth 2 (two) times daily. 2 in the AM, 1 in the PM 360 tablet 1  . sodium chloride (OCEAN) 0.65 % SOLN nasal spray Place 1 spray into both nostrils daily as needed for congestion.    . tamsulosin (FLOMAX) 0.4 MG CAPS capsule Take 0.4 mg by mouth every morning.     No current facility-administered medications on file prior to visit.     Observations/Objective:   There were no vitals filed for this visit. GEN:  The patient appears stated age and is in NAD.  Neurological examination:  Orientation: The patient is alert and oriented x3. Cranial nerves: There is good facial symmetry. There is no facial hypomimia.  The speech is fluent and clear. Soft palate rises symmetrically and there is no tongue deviation. Hearing is intact to conversational tone. Motor: Strength is at least antigravity x 4.   Shoulder shrug is equal and symmetric.  There is no pronator drift.  Movement examination: Tone: unable Abnormal movements: no rest tremor.  At least mod postural tremor  on the R and mild to mod on the L Gait and Station: The patient ambulates well in the home   Assessment and Plan:   1.  Essential tremor, at least mod on the R  -Continue primidone, 50 mg, 2 tablets twice per day  -Discussed adding propranolol.  Reviewed BP and pulse from urology visit about a week ago and decided to try it at low dose.  Will start 10 mg tid.  R/B/SE were discussed.  The opportunity to ask questions was given and they were answered to the best of my ability.  The patient expressed understanding and willingness to follow the outlined treatment protocols.  -pt did have fairly  significant tremor today.  May not be well controlled without surgical intervention  -discussed covid vaccine.  Pt declined.  I would recommend  2.  Gait instability  -Daughter asks about shuffling feet.  I did not see that today, but patient admits that it is possible he shuffles intermittently.  He and I did discuss that his gait could be off because of diabetic neuropathy.  He certainly thinks that is a possibility.  He has had diabetes for 20 years.  We will get a better exam next time in the office.  Follow Up Instructions:  office will call him to arrange f/u appt  -I discussed the assessment and treatment plan with the patient. The patient was provided an opportunity to ask questions and all were answered. The patient agreed with the plan and demonstrated an understanding of the instructions.   The patient was advised to call back or seek an in-person evaluation if the symptoms worsen or if the condition fails to improve as anticipated.    Total time spent on today's visit was 30 minutes, including both face-to-face time and nonface-to-face time.  Time included that spent on review of records (prior notes available to me/labs/imaging if pertinent), discussing treatment and goals, answering patient's questions and coordinating care.   Kerin Salen, DO

## 2019-11-18 ENCOUNTER — Encounter: Payer: Self-pay | Admitting: Neurology

## 2019-11-19 ENCOUNTER — Other Ambulatory Visit: Payer: Self-pay

## 2019-11-19 ENCOUNTER — Telehealth (INDEPENDENT_AMBULATORY_CARE_PROVIDER_SITE_OTHER): Payer: Medicare Other | Admitting: Neurology

## 2019-11-19 DIAGNOSIS — E1142 Type 2 diabetes mellitus with diabetic polyneuropathy: Secondary | ICD-10-CM

## 2019-11-19 DIAGNOSIS — G250 Essential tremor: Secondary | ICD-10-CM | POA: Diagnosis not present

## 2019-11-19 MED ORDER — PROPRANOLOL HCL 10 MG PO TABS: 10.0000 mg | ORAL_TABLET | Freq: Three times a day (TID) | ORAL | 1 refills | Status: DC

## 2019-12-02 DIAGNOSIS — I10 Essential (primary) hypertension: Secondary | ICD-10-CM | POA: Diagnosis not present

## 2019-12-02 DIAGNOSIS — R739 Hyperglycemia, unspecified: Secondary | ICD-10-CM | POA: Diagnosis not present

## 2019-12-02 DIAGNOSIS — Z125 Encounter for screening for malignant neoplasm of prostate: Secondary | ICD-10-CM | POA: Diagnosis not present

## 2019-12-02 DIAGNOSIS — E538 Deficiency of other specified B group vitamins: Secondary | ICD-10-CM | POA: Diagnosis not present

## 2019-12-02 DIAGNOSIS — E7800 Pure hypercholesterolemia, unspecified: Secondary | ICD-10-CM | POA: Diagnosis not present

## 2019-12-09 DIAGNOSIS — M9903 Segmental and somatic dysfunction of lumbar region: Secondary | ICD-10-CM | POA: Diagnosis not present

## 2019-12-09 DIAGNOSIS — M5136 Other intervertebral disc degeneration, lumbar region: Secondary | ICD-10-CM | POA: Diagnosis not present

## 2019-12-09 DIAGNOSIS — M542 Cervicalgia: Secondary | ICD-10-CM | POA: Diagnosis not present

## 2019-12-09 DIAGNOSIS — M545 Low back pain: Secondary | ICD-10-CM | POA: Diagnosis not present

## 2019-12-10 DIAGNOSIS — Z0000 Encounter for general adult medical examination without abnormal findings: Secondary | ICD-10-CM | POA: Diagnosis not present

## 2019-12-10 DIAGNOSIS — E119 Type 2 diabetes mellitus without complications: Secondary | ICD-10-CM | POA: Diagnosis not present

## 2019-12-10 DIAGNOSIS — E875 Hyperkalemia: Secondary | ICD-10-CM | POA: Diagnosis not present

## 2019-12-10 DIAGNOSIS — E538 Deficiency of other specified B group vitamins: Secondary | ICD-10-CM | POA: Diagnosis not present

## 2019-12-17 ENCOUNTER — Telehealth: Payer: Self-pay

## 2019-12-17 MED ORDER — PRIMIDONE 50 MG PO TABS: 100.0000 mg | ORAL_TABLET | Freq: Two times a day (BID) | ORAL | 1 refills | Status: DC

## 2019-12-17 NOTE — Telephone Encounter (Signed)
Refill request for primidone 50mg .  Unclear what dose patient is actually taking.  Attempted to clarify with patients wife but she was confused between 50mg  and 100mg .  Please advise appropriate refill.

## 2019-12-18 ENCOUNTER — Telehealth: Payer: Self-pay | Admitting: Neurology

## 2019-12-18 NOTE — Telephone Encounter (Signed)
Left message for patient to contact office.

## 2019-12-18 NOTE — Telephone Encounter (Signed)
Patient called and left a message following up on a call from yesterday.   The encounter was closed/signed without resolution. Here are the details:  Refill request for primidone 50mg .  Unclear what dose patient is actually taking.  Attempted to clarify with patients wife but she was confused between 50mg  and 100mg .  Please advise appropriate refill.  Please return call from patient.

## 2019-12-18 NOTE — Telephone Encounter (Signed)
The medication was sent to the pharmacy yesterday with the dosage he was to be taking.  Do they still have a question?

## 2019-12-21 NOTE — Telephone Encounter (Signed)
Spoke with patient and reviewed Dr Don Perking last note. Primadone is to be taken 2 tabs twice a day. Patient voiced understanding. He stated he just wanted to make sure we had his prescriptions correct because someone from our office called him to verify his rxs.  Issue resolved.

## 2020-01-06 DIAGNOSIS — M542 Cervicalgia: Secondary | ICD-10-CM | POA: Diagnosis not present

## 2020-01-06 DIAGNOSIS — M5136 Other intervertebral disc degeneration, lumbar region: Secondary | ICD-10-CM | POA: Diagnosis not present

## 2020-01-06 DIAGNOSIS — M545 Low back pain: Secondary | ICD-10-CM | POA: Diagnosis not present

## 2020-01-06 DIAGNOSIS — M9903 Segmental and somatic dysfunction of lumbar region: Secondary | ICD-10-CM | POA: Diagnosis not present

## 2020-02-03 DIAGNOSIS — M545 Low back pain: Secondary | ICD-10-CM | POA: Diagnosis not present

## 2020-02-03 DIAGNOSIS — M9903 Segmental and somatic dysfunction of lumbar region: Secondary | ICD-10-CM | POA: Diagnosis not present

## 2020-02-03 DIAGNOSIS — M5136 Other intervertebral disc degeneration, lumbar region: Secondary | ICD-10-CM | POA: Diagnosis not present

## 2020-02-03 DIAGNOSIS — M542 Cervicalgia: Secondary | ICD-10-CM | POA: Diagnosis not present

## 2020-02-22 DIAGNOSIS — Z1211 Encounter for screening for malignant neoplasm of colon: Secondary | ICD-10-CM | POA: Diagnosis not present

## 2020-02-22 DIAGNOSIS — K6389 Other specified diseases of intestine: Secondary | ICD-10-CM | POA: Diagnosis not present

## 2020-02-22 DIAGNOSIS — D125 Benign neoplasm of sigmoid colon: Secondary | ICD-10-CM | POA: Diagnosis not present

## 2020-02-22 DIAGNOSIS — Z86010 Personal history of colonic polyps: Secondary | ICD-10-CM | POA: Diagnosis not present

## 2020-02-22 DIAGNOSIS — D123 Benign neoplasm of transverse colon: Secondary | ICD-10-CM | POA: Diagnosis not present

## 2020-02-22 DIAGNOSIS — D124 Benign neoplasm of descending colon: Secondary | ICD-10-CM | POA: Diagnosis not present

## 2020-02-22 DIAGNOSIS — D134 Benign neoplasm of liver: Secondary | ICD-10-CM | POA: Diagnosis not present

## 2020-02-22 DIAGNOSIS — K5730 Diverticulosis of large intestine without perforation or abscess without bleeding: Secondary | ICD-10-CM | POA: Diagnosis not present

## 2020-02-22 DIAGNOSIS — K635 Polyp of colon: Secondary | ICD-10-CM | POA: Diagnosis not present

## 2020-03-02 DIAGNOSIS — M545 Low back pain: Secondary | ICD-10-CM | POA: Diagnosis not present

## 2020-03-02 DIAGNOSIS — M542 Cervicalgia: Secondary | ICD-10-CM | POA: Diagnosis not present

## 2020-03-02 DIAGNOSIS — M5136 Other intervertebral disc degeneration, lumbar region: Secondary | ICD-10-CM | POA: Diagnosis not present

## 2020-03-02 DIAGNOSIS — M9903 Segmental and somatic dysfunction of lumbar region: Secondary | ICD-10-CM | POA: Diagnosis not present

## 2020-03-16 NOTE — Progress Notes (Signed)
Assessment/Plan:    1.  Essential Tremor  -Continue primidone, 50 mg, 2 tablets twice per day  -change propranolol from 10 mg tid to 20 mg bid.  R/B/SE were discussed.  The opportunity to ask questions was given and they were answered to the best of my ability.  The patient expressed understanding and willingness to follow the outlined treatment protocols.  2.  Vivid dreams  -not really in the nature of true RBD.  No evidence of Parkinsons Disease.  Discussed that today.  3.  Memory change  -discussed neurocog testing.  Pt will think about it.  Declines for now.  Subjective:   Antonio Douglas was seen today in follow up for essential tremor.  My previous records were reviewed prior to todays visit.  Propranolol initiated last visit.  He reports that it is "kicking in a little bit and helping some."  No SE.    pt denies falls.  Pt denies lightheadedness, near syncope.  No hallucinations.  Mood has been good.  Wife asks about vivid dreams - been since they were married but worse lately.  Wife also complains of memory change.  Pt not concerned.  Current prescribed movement disorder medications: Primidone, 50 mg, 2 tablets twice per day Propranolol, 10 mg 3 times per day (started last visit)  ALLERGIES:  No Known Allergies  CURRENT MEDICATIONS:  Outpatient Encounter Medications as of 03/18/2020  Medication Sig  . aspirin EC 81 MG tablet Take 81 mg by mouth every evening.  Marland Kitchen atorvastatin (LIPITOR) 40 MG tablet TK 1 T PO QD  . Cyanocobalamin (VITAMIN B12 PO) Take by mouth.  Marland Kitchen glimepiride (AMARYL) 2 MG tablet TK 1 T PO QD WITH BRE OR THE FIRST MAIN MEAL OF THE DAY  . lisinopril (PRINIVIL,ZESTRIL) 5 MG tablet Take 5 mg by mouth every morning.  . loratadine (CLARITIN) 10 MG tablet Take 10 mg by mouth daily as needed for allergies.  . metFORMIN (GLUCOPHAGE) 1000 MG tablet Take 1,000 mg by mouth 2 (two) times daily.  . pioglitazone (ACTOS) 30 MG tablet Take 30 mg by mouth every evening.  .  primidone (MYSOLINE) 50 MG tablet Take 2 tablets (100 mg total) by mouth 2 (two) times daily.  . propranolol (INDERAL) 10 MG tablet Take 1 tablet (10 mg total) by mouth 3 (three) times daily.  . sodium chloride (OCEAN) 0.65 % SOLN nasal spray Place 1 spray into both nostrils daily as needed for congestion.  . tamsulosin (FLOMAX) 0.4 MG CAPS capsule Take 0.4 mg by mouth every morning.   No facility-administered encounter medications on file as of 03/18/2020.     Objective:    PHYSICAL EXAMINATION:    VITALS:   Vitals:   03/18/20 0850  BP: 125/71  Pulse: 65  Resp: 20  SpO2: 99%  Weight: 221 lb (100.2 kg)  Height: 6' (1.829 m)    GEN:  The patient appears stated age and is in NAD. HEENT:  Normocephalic, atraumatic.  The mucous membranes are moist. The superficial temporal arteries are without ropiness or tenderness. CV:  RRR Lungs:  CTAB Neck/HEME:  There are no carotid bruits bilaterally.  Neurological examination:  Orientation: The patient is alert and oriented x3. Cranial nerves: There is good facial symmetry. The speech is fluent and clear. Soft palate rises symmetrically and there is no tongue deviation. Hearing is intact to conversational tone. Sensation: Sensation is intact to light touch throughout Motor: Strength is at least antigravity x4.  Movement examination:  Tone: There is normal tone in the UE/LE Abnormal movements: there is mild postural tremor.  Archimedes spirals are drawn fairly well with mild tremor Coordination:  There is no decremation with RAM's Gait and Station: The patient has no difficulty arising out of a deep-seated chair without the use of the hands. The patient's stride length is good  Total time spent on today's visit was 30 minutes, including both face-to-face time and nonface-to-face time.  Time included that spent on review of records (prior notes available to me/labs/imaging if pertinent), discussing treatment and goals, answering patient's  questions and coordinating care.  Cc:  Pearson Grippe, MD

## 2020-03-18 ENCOUNTER — Encounter: Payer: Self-pay | Admitting: Neurology

## 2020-03-18 ENCOUNTER — Other Ambulatory Visit: Payer: Self-pay | Admitting: Neurology

## 2020-03-18 ENCOUNTER — Ambulatory Visit: Payer: Medicare Other | Admitting: Neurology

## 2020-03-18 ENCOUNTER — Other Ambulatory Visit: Payer: Self-pay

## 2020-03-18 VITALS — BP 125/71 | HR 65 | Resp 20 | Ht 72.0 in | Wt 221.0 lb

## 2020-03-18 DIAGNOSIS — G250 Essential tremor: Secondary | ICD-10-CM

## 2020-03-18 MED ORDER — PROPRANOLOL HCL 20 MG PO TABS: 20.0000 mg | ORAL_TABLET | Freq: Two times a day (BID) | ORAL | 1 refills | Status: DC

## 2020-03-18 NOTE — Patient Instructions (Signed)
Increase propranolol to 20 mg TWICE per day  The physicians and staff at [facility] are committed to providing excellent care. You may receive a survey requesting feedback about your experience at our office. We strive to receive "very good" responses to the survey questions. If you feel that your experience would prevent you from giving the office a "very good " response, please contact our office to try to remedy the situation. We may be reached at [phone]. Thank you for taking the time out of your busy day to complete the survey.

## 2020-03-30 DIAGNOSIS — M9903 Segmental and somatic dysfunction of lumbar region: Secondary | ICD-10-CM | POA: Diagnosis not present

## 2020-03-30 DIAGNOSIS — M545 Low back pain: Secondary | ICD-10-CM | POA: Diagnosis not present

## 2020-03-30 DIAGNOSIS — M5136 Other intervertebral disc degeneration, lumbar region: Secondary | ICD-10-CM | POA: Diagnosis not present

## 2020-03-30 DIAGNOSIS — M542 Cervicalgia: Secondary | ICD-10-CM | POA: Diagnosis not present

## 2020-04-15 DIAGNOSIS — H6983 Other specified disorders of Eustachian tube, bilateral: Secondary | ICD-10-CM | POA: Diagnosis not present

## 2020-04-15 DIAGNOSIS — J310 Chronic rhinitis: Secondary | ICD-10-CM | POA: Diagnosis not present

## 2020-04-15 DIAGNOSIS — J343 Hypertrophy of nasal turbinates: Secondary | ICD-10-CM | POA: Diagnosis not present

## 2020-04-15 DIAGNOSIS — H903 Sensorineural hearing loss, bilateral: Secondary | ICD-10-CM | POA: Diagnosis not present

## 2020-05-04 DIAGNOSIS — H52223 Regular astigmatism, bilateral: Secondary | ICD-10-CM | POA: Diagnosis not present

## 2020-05-09 DIAGNOSIS — R972 Elevated prostate specific antigen [PSA]: Secondary | ICD-10-CM | POA: Diagnosis not present

## 2020-05-09 DIAGNOSIS — R351 Nocturia: Secondary | ICD-10-CM | POA: Diagnosis not present

## 2020-05-09 DIAGNOSIS — N401 Enlarged prostate with lower urinary tract symptoms: Secondary | ICD-10-CM | POA: Diagnosis not present

## 2020-05-09 DIAGNOSIS — R3915 Urgency of urination: Secondary | ICD-10-CM | POA: Diagnosis not present

## 2020-05-14 ENCOUNTER — Other Ambulatory Visit: Payer: Self-pay | Admitting: Neurology

## 2020-05-17 NOTE — Telephone Encounter (Signed)
Rx(s) sent to pharmacy electronically.  

## 2020-06-02 DIAGNOSIS — E538 Deficiency of other specified B group vitamins: Secondary | ICD-10-CM | POA: Diagnosis not present

## 2020-06-02 DIAGNOSIS — E7800 Pure hypercholesterolemia, unspecified: Secondary | ICD-10-CM | POA: Diagnosis not present

## 2020-06-02 DIAGNOSIS — I10 Essential (primary) hypertension: Secondary | ICD-10-CM | POA: Diagnosis not present

## 2020-06-02 DIAGNOSIS — E119 Type 2 diabetes mellitus without complications: Secondary | ICD-10-CM | POA: Diagnosis not present

## 2020-06-02 DIAGNOSIS — R7989 Other specified abnormal findings of blood chemistry: Secondary | ICD-10-CM | POA: Diagnosis not present

## 2020-06-09 DIAGNOSIS — E119 Type 2 diabetes mellitus without complications: Secondary | ICD-10-CM | POA: Diagnosis not present

## 2020-06-09 DIAGNOSIS — I10 Essential (primary) hypertension: Secondary | ICD-10-CM | POA: Diagnosis not present

## 2020-06-09 DIAGNOSIS — E7800 Pure hypercholesterolemia, unspecified: Secondary | ICD-10-CM | POA: Diagnosis not present

## 2020-06-09 DIAGNOSIS — R011 Cardiac murmur, unspecified: Secondary | ICD-10-CM | POA: Diagnosis not present

## 2020-06-16 DIAGNOSIS — J310 Chronic rhinitis: Secondary | ICD-10-CM | POA: Diagnosis not present

## 2020-06-16 DIAGNOSIS — J342 Deviated nasal septum: Secondary | ICD-10-CM | POA: Diagnosis not present

## 2020-06-16 DIAGNOSIS — J343 Hypertrophy of nasal turbinates: Secondary | ICD-10-CM | POA: Diagnosis not present

## 2020-07-20 DIAGNOSIS — M9903 Segmental and somatic dysfunction of lumbar region: Secondary | ICD-10-CM | POA: Diagnosis not present

## 2020-07-20 DIAGNOSIS — M545 Low back pain, unspecified: Secondary | ICD-10-CM | POA: Diagnosis not present

## 2020-07-20 DIAGNOSIS — M542 Cervicalgia: Secondary | ICD-10-CM | POA: Diagnosis not present

## 2020-07-20 DIAGNOSIS — M5136 Other intervertebral disc degeneration, lumbar region: Secondary | ICD-10-CM | POA: Diagnosis not present

## 2020-07-27 DIAGNOSIS — M5136 Other intervertebral disc degeneration, lumbar region: Secondary | ICD-10-CM | POA: Diagnosis not present

## 2020-07-27 DIAGNOSIS — M545 Low back pain, unspecified: Secondary | ICD-10-CM | POA: Diagnosis not present

## 2020-07-27 DIAGNOSIS — M542 Cervicalgia: Secondary | ICD-10-CM | POA: Diagnosis not present

## 2020-07-27 DIAGNOSIS — M9903 Segmental and somatic dysfunction of lumbar region: Secondary | ICD-10-CM | POA: Diagnosis not present

## 2020-08-12 ENCOUNTER — Other Ambulatory Visit: Payer: Self-pay | Admitting: Neurology

## 2020-08-17 DIAGNOSIS — M9901 Segmental and somatic dysfunction of cervical region: Secondary | ICD-10-CM | POA: Diagnosis not present

## 2020-08-17 DIAGNOSIS — M542 Cervicalgia: Secondary | ICD-10-CM | POA: Diagnosis not present

## 2020-08-17 DIAGNOSIS — M9903 Segmental and somatic dysfunction of lumbar region: Secondary | ICD-10-CM | POA: Diagnosis not present

## 2020-08-17 DIAGNOSIS — M5136 Other intervertebral disc degeneration, lumbar region: Secondary | ICD-10-CM | POA: Diagnosis not present

## 2020-08-25 DIAGNOSIS — M5136 Other intervertebral disc degeneration, lumbar region: Secondary | ICD-10-CM | POA: Diagnosis not present

## 2020-08-25 DIAGNOSIS — M9901 Segmental and somatic dysfunction of cervical region: Secondary | ICD-10-CM | POA: Diagnosis not present

## 2020-08-25 DIAGNOSIS — M542 Cervicalgia: Secondary | ICD-10-CM | POA: Diagnosis not present

## 2020-08-25 DIAGNOSIS — M9903 Segmental and somatic dysfunction of lumbar region: Secondary | ICD-10-CM | POA: Diagnosis not present

## 2020-08-29 DIAGNOSIS — M5136 Other intervertebral disc degeneration, lumbar region: Secondary | ICD-10-CM | POA: Diagnosis not present

## 2020-08-29 DIAGNOSIS — M9903 Segmental and somatic dysfunction of lumbar region: Secondary | ICD-10-CM | POA: Diagnosis not present

## 2020-08-29 DIAGNOSIS — M9901 Segmental and somatic dysfunction of cervical region: Secondary | ICD-10-CM | POA: Diagnosis not present

## 2020-08-29 DIAGNOSIS — M542 Cervicalgia: Secondary | ICD-10-CM | POA: Diagnosis not present

## 2020-08-31 DIAGNOSIS — M9903 Segmental and somatic dysfunction of lumbar region: Secondary | ICD-10-CM | POA: Diagnosis not present

## 2020-08-31 DIAGNOSIS — M9901 Segmental and somatic dysfunction of cervical region: Secondary | ICD-10-CM | POA: Diagnosis not present

## 2020-08-31 DIAGNOSIS — M542 Cervicalgia: Secondary | ICD-10-CM | POA: Diagnosis not present

## 2020-08-31 DIAGNOSIS — M5136 Other intervertebral disc degeneration, lumbar region: Secondary | ICD-10-CM | POA: Diagnosis not present

## 2020-09-05 DIAGNOSIS — M9903 Segmental and somatic dysfunction of lumbar region: Secondary | ICD-10-CM | POA: Diagnosis not present

## 2020-09-05 DIAGNOSIS — M542 Cervicalgia: Secondary | ICD-10-CM | POA: Diagnosis not present

## 2020-09-05 DIAGNOSIS — M9901 Segmental and somatic dysfunction of cervical region: Secondary | ICD-10-CM | POA: Diagnosis not present

## 2020-09-05 DIAGNOSIS — M5136 Other intervertebral disc degeneration, lumbar region: Secondary | ICD-10-CM | POA: Diagnosis not present

## 2020-09-12 DIAGNOSIS — M5136 Other intervertebral disc degeneration, lumbar region: Secondary | ICD-10-CM | POA: Diagnosis not present

## 2020-09-12 DIAGNOSIS — M9903 Segmental and somatic dysfunction of lumbar region: Secondary | ICD-10-CM | POA: Diagnosis not present

## 2020-09-12 DIAGNOSIS — M542 Cervicalgia: Secondary | ICD-10-CM | POA: Diagnosis not present

## 2020-09-12 DIAGNOSIS — M9901 Segmental and somatic dysfunction of cervical region: Secondary | ICD-10-CM | POA: Diagnosis not present

## 2020-09-13 DIAGNOSIS — M9901 Segmental and somatic dysfunction of cervical region: Secondary | ICD-10-CM | POA: Diagnosis not present

## 2020-09-13 DIAGNOSIS — M542 Cervicalgia: Secondary | ICD-10-CM | POA: Diagnosis not present

## 2020-09-13 DIAGNOSIS — M5136 Other intervertebral disc degeneration, lumbar region: Secondary | ICD-10-CM | POA: Diagnosis not present

## 2020-09-13 DIAGNOSIS — M9903 Segmental and somatic dysfunction of lumbar region: Secondary | ICD-10-CM | POA: Diagnosis not present

## 2020-09-14 DIAGNOSIS — M9901 Segmental and somatic dysfunction of cervical region: Secondary | ICD-10-CM | POA: Diagnosis not present

## 2020-09-14 DIAGNOSIS — M542 Cervicalgia: Secondary | ICD-10-CM | POA: Diagnosis not present

## 2020-09-14 DIAGNOSIS — M5136 Other intervertebral disc degeneration, lumbar region: Secondary | ICD-10-CM | POA: Diagnosis not present

## 2020-09-14 DIAGNOSIS — M9903 Segmental and somatic dysfunction of lumbar region: Secondary | ICD-10-CM | POA: Diagnosis not present

## 2020-09-19 DIAGNOSIS — M9903 Segmental and somatic dysfunction of lumbar region: Secondary | ICD-10-CM | POA: Diagnosis not present

## 2020-09-19 DIAGNOSIS — M9901 Segmental and somatic dysfunction of cervical region: Secondary | ICD-10-CM | POA: Diagnosis not present

## 2020-09-19 DIAGNOSIS — M5136 Other intervertebral disc degeneration, lumbar region: Secondary | ICD-10-CM | POA: Diagnosis not present

## 2020-09-19 DIAGNOSIS — M542 Cervicalgia: Secondary | ICD-10-CM | POA: Diagnosis not present

## 2020-09-19 NOTE — Progress Notes (Signed)
Assessment/Plan:    1.  Essential Tremor  -Continue primidone, 50 mg, 2 tablets twice per day.  Daughter asked if meds caused EDS and told him he could try taking all 4 q hs and see if that helped.  He may have other reasons to have EDS as well.  -Propranolol, 20 mg twice per day.  -he asked about focused ultrasound.  Discussed that today   2.  Memory change  -Declines neurocognitive testing.  Will let me know if he changes his mind.  3.  Probable raynauds phenomenon  -told him to talk with pcp  4.  Diabetic PN  --will send referral for PT given gait instability.  Subjective:   Antonio Douglas was seen today in follow up for essential tremor.  My previous records were reviewed prior to todays visit.  Daughter supplements hx.  Tremor is about the same - no better and no worse.  Had 3 falls since last.  2 of them he tripped over a curb.  One he tripped over a garden gnome.  Pt denies falls.  Pt denies lightheadedness, near syncope.  No hallucinations.  Mood has been good.  Urology notes reviewed from July 26.  On oxybutynin but only taking prn.  States that legs feel weak - states his doctor told him he needs to exercise more.  Pt thinks that he is getting more.  Noting that occasionally fingertips getting white.    Current prescribed movement disorder medications: Primidone, 50 mg, 2 tablets twice per day Propranolol, 20 mg twice daily (increased from 10 mg 3 times per day)   ALLERGIES:  No Known Allergies  CURRENT MEDICATIONS:  Outpatient Encounter Medications as of 09/22/2020  Medication Sig  . atorvastatin (LIPITOR) 40 MG tablet Take 40 mg by mouth daily.  . Cyanocobalamin (VITAMIN B12 PO) Take 1 tablet by mouth daily.  Marland Kitchen glimepiride (AMARYL) 2 MG tablet TK 1 T PO QD WITH BRE OR THE FIRST MAIN MEAL OF THE DAY  . lisinopril (PRINIVIL,ZESTRIL) 5 MG tablet Take 5 mg by mouth every morning.  . loratadine (CLARITIN) 10 MG tablet Take 10 mg by mouth daily as needed for allergies.   . metFORMIN (GLUCOPHAGE) 1000 MG tablet Take 1,000 mg by mouth 2 (two) times daily.  . pioglitazone (ACTOS) 30 MG tablet Take 30 mg by mouth every evening.  . primidone (MYSOLINE) 50 MG tablet TAKE 2 TABLETS BY MOUTH 2 TIMES DAILY  . propranolol (INDERAL) 20 MG tablet TAKE 1 TABLET BY MOUTH 2 TIMES DAILY  . sodium chloride (OCEAN) 0.65 % SOLN nasal spray Place 1 spray into both nostrils daily as needed for congestion.  . tamsulosin (FLOMAX) 0.4 MG CAPS capsule Take 0.4 mg by mouth every morning.  Marland Kitchen oxybutynin (DITROPAN) 5 MG tablet Take 5 mg by mouth as needed.  . [DISCONTINUED] aspirin EC 81 MG tablet Take 81 mg by mouth every evening. (Patient not taking: Reported on 09/22/2020)  . [DISCONTINUED] propranolol (INDERAL) 20 MG tablet Take 1 tablet (20 mg total) by mouth 2 (two) times daily. (Patient not taking: Reported on 09/22/2020)   No facility-administered encounter medications on file as of 09/22/2020.     Objective:    PHYSICAL EXAMINATION:    VITALS:   Vitals:   09/22/20 1043  BP: (!) 158/74  Pulse: (!) 56  SpO2: 99%  Weight: 229 lb (103.9 kg)  Height: 6' (1.829 m)    GEN:  The patient appears stated age and is in NAD.  HEENT:  Normocephalic, atraumatic.  The mucous membranes are moist. The superficial temporal arteries are without ropiness or tenderness. CV:  Huston Foley.  regular Lungs:  CTAB Neck/HEME:  There are no carotid bruits bilaterally.  Neurological examination:  Orientation: The patient is alert and oriented x3. Cranial nerves: There is good facial symmetry. The speech is fluent and clear. Soft palate rises symmetrically and there is no tongue deviation. Hearing is intact to conversational tone. Sensation: Sensation is intact to light touch throughout Motor: Strength is at least antigravity x4.  Movement examination: Tone: There is normal tone in the UE/LE Abnormal movements: there is postural tremor, R>L Gait and Station: The patient pushes off of the chair.   He is flexed at the waist.     Total time spent on today's visit was 30 minutes, including both face-to-face time and nonface-to-face time.  Time included that spent on review of records (prior notes available to me/labs/imaging if pertinent), discussing treatment and goals, answering patient's questions and coordinating care.  Cc:  Pearson Grippe, MD

## 2020-09-21 DIAGNOSIS — M542 Cervicalgia: Secondary | ICD-10-CM | POA: Diagnosis not present

## 2020-09-21 DIAGNOSIS — M9901 Segmental and somatic dysfunction of cervical region: Secondary | ICD-10-CM | POA: Diagnosis not present

## 2020-09-21 DIAGNOSIS — M9903 Segmental and somatic dysfunction of lumbar region: Secondary | ICD-10-CM | POA: Diagnosis not present

## 2020-09-21 DIAGNOSIS — M5136 Other intervertebral disc degeneration, lumbar region: Secondary | ICD-10-CM | POA: Diagnosis not present

## 2020-09-22 ENCOUNTER — Other Ambulatory Visit: Payer: Self-pay

## 2020-09-22 ENCOUNTER — Encounter: Payer: Self-pay | Admitting: Neurology

## 2020-09-22 ENCOUNTER — Ambulatory Visit: Payer: Medicare Other | Admitting: Neurology

## 2020-09-22 VITALS — BP 158/74 | HR 56 | Ht 72.0 in | Wt 229.0 lb

## 2020-09-22 DIAGNOSIS — E1142 Type 2 diabetes mellitus with diabetic polyneuropathy: Secondary | ICD-10-CM | POA: Diagnosis not present

## 2020-09-22 DIAGNOSIS — G250 Essential tremor: Secondary | ICD-10-CM | POA: Diagnosis not present

## 2020-09-22 NOTE — Patient Instructions (Signed)
You have been referred to [facility] for therapy. They will call you directly to schedule an appointment.  Please call [phone] if you do not hear from them.   Make a follow up appointment with PCP to discuss whether or not you may have Raynauds syndrome.  The physicians and staff at [facility] are committed to providing excellent care. You may receive a survey requesting feedback about your experience at our office. We strive to receive "very good" responses to the survey questions. If you feel that your experience would prevent you from giving the office a "very good " response, please contact our office to try to remedy the situation. We may be reached at [phone]. Thank you for taking the time out of your busy day to complete the survey.

## 2020-09-23 ENCOUNTER — Ambulatory Visit: Payer: Medicare Other | Attending: Neurology

## 2020-09-23 DIAGNOSIS — R2689 Other abnormalities of gait and mobility: Secondary | ICD-10-CM

## 2020-09-23 DIAGNOSIS — M6281 Muscle weakness (generalized): Secondary | ICD-10-CM | POA: Diagnosis not present

## 2020-09-23 NOTE — Therapy (Addendum)
[facility] [address] [place], [place], [address] Phone: [phone]   Fax:  [phone]  Physical Therapy Evaluation  Patient Details  Name: Antonio Douglas MRN: [id number] Date of Birth: Aug 24, 1941 Referring Provider (PT): Kerin Salen   Encounter Date: 09/23/2020   PT End of Session - 09/23/20 1323    Visit Number 1    Number of Visits 13    Date for PT Re-Evaluation 12/23/20   60 day poc, 90 day cert   Authorization Type BCBS medicare (10th visit progress note)    PT Start Time 1317    PT Stop Time 1404    PT Time Calculation (min) 47 min    Activity Tolerance Patient tolerated treatment well    Behavior During Therapy [facility] for tasks assessed/performed           Past Medical History:  Diagnosis Date  . BPH (benign prostatic hyperplasia)    mild  . Diabetes mellitus without complication (HCC)   . Heart murmur   . Hypertension   . Pneumothorax    spontaneous    Past Surgical History:  Procedure Laterality Date  . CATARACT EXTRACTION, BILATERAL    . colonscopy  2016   dr Kinnie Scales  . INGUINAL HERNIA REPAIR Bilateral 01/17/2016   Procedure: LAPAROSCOPIC BILATERAL INGUINAL HERNIA REPAIR;  Surgeon: Ovidio Kin, MD;  Location: [facility];  Service: General;  Laterality: Bilateral;  With MESH    There were no vitals filed for this visit.    Subjective Assessment - 09/23/20 1322    Subjective Pt was referred from Dr. Arbutus Leas for diabetic peripheral neuropathy. Pt has been diabetic since 1996. Pt has hearing aides but seldom wears them. Pt reports thta his biggest issues is weakness in legs and lower back. Has been most noticable the last 6 months. Noticed more trouble with getting on/off boat as really enjoys fishing and getting out of chairs now has to use hands to get up. The issue with getting off boat is stepping up on to the dock with the big step. 3 falls in last 6 months mostly due to trying to step up high or  tripping on something. Pt does not check sugar but does have meter. His A1C was around 6.6.    Patient is accompained by: Family member    Patient Stated Goals Pt would like to be able to stand up quickly.    Currently in Pain? No/denies   sees chiropractor regularly and no pain currently but right side has bothered him on and off over the years.             [facility] PT Assessment - 09/23/20 1326      Assessment   Medical Diagnosis diabetic peripheral neuropathy    Referring Provider (PT) Lurena Joiner Tat    Onset Date/Surgical Date 09/22/20   referral date   Hand Dominance Right    Prior Therapy none      Precautions   Precautions Fall      Balance Screen   Has the patient fallen in the past 6 months Yes    How many times? 3    Has the patient had a decrease in activity level because of a fear of falling?  No    Is the patient reluctant to leave their home because of a fear of falling?  No      Home Nurse, mental health Private residence    Living Arrangements Spouse/significant other;Children  daughter and her husband   Available Help at Discharge Family    Type of Home House    Home Access Stairs to enter    Entrance Stairs-Number of Steps 1    Entrance Stairs-Rails None    Home Layout Two level    Alternate Level Stairs-Number of Steps 13    Alternate Level Stairs-Rails Left    Home Equipment Cane - quad;Cane - single point;Grab bars - tub/shower      Prior Function   Level of Independence Independent;Independent with community mobility without device    Vocation Retired    Leisure fishing, yard work, Midwife.      Observation/Other Assessments   Skin Integrity does note increased veins in feet. Has rash behind left knee that he is going to see the doctor for.      Sensation   Light Touch Appears Intact    Additional Comments Pt is intact to light touch in bilateral feet and legs.      Coordination   Fine Motor Movements are Fluid and  Coordinated --   essential tremor     ROM / Strength   AROM / PROM / Strength Strength;AROM      AROM   Overall AROM Comments Pt lacking hip extension to about 10 degrees from neutral      Strength   Strength Assessment Site Shoulder;Elbow;Hip;Knee;Ankle    Right/Left Shoulder Right;Left    Right Shoulder Flexion 4+/5    Left Shoulder Flexion 4+/5    Right/Left Elbow Right;Left    Right Elbow Flexion 4+/5    Right Elbow Extension 4+/5    Left Elbow Flexion 4+/5    Left Elbow Extension 4+/5    Right/Left Hip Right;Left    Right Hip Flexion 4/5    Right Hip Extension 4/5    Right Hip ABduction 4/5    Left Hip Flexion 4/5    Left Hip Extension 3+/5    Left Hip ABduction 4/5    Right/Left Knee Right;Left    Right Knee Flexion 4+/5    Right Knee Extension 4+/5    Left Knee Flexion 4+/5    Left Knee Extension 4+/5    Right/Left Ankle Right;Left    Right Ankle Dorsiflexion 4+/5    Right Ankle Plantar Flexion 4+/5    Left Ankle Dorsiflexion 4+/5    Left Ankle Plantar Flexion 4+/5      Transfers   Transfers Sit to Stand;Stand to Sit    Sit to Stand 6: Modified independent (Device/Increase time)    Five time sit to stand comments  13.64sec from chair with UE support    Stand to Sit 6: Modified independent (Device/Increase time)      Ambulation/Gait   Ambulation/Gait Yes    Ambulation/Gait Assistance 6: Modified independent (Device/Increase time)    Ambulation/Gait Assistance Details Pt has decreased hip extension as well as decreased arm swing.    Ambulation Distance (Feet) 100 Feet    Assistive device None    Gait Pattern Step-through pattern;Decreased step length - right;Decreased step length - left    Ambulation Surface Level;Indoor    Gait velocity 9.12 sec=1.12m/s    Stairs Yes    Stairs Assistance 5: Supervision    Stair Management Technique Alternating pattern;One rail Left    Number of Stairs 8    Height of Stairs 6                      Objective  measurements  completed on examination: See above findings.               PT Education - 09/24/20 1510    Education Details PT plan of care and results of testing    Person(s) Educated Patient;Spouse    Methods Explanation    Comprehension Verbalized understanding            PT Short Term Goals - 09/24/20 1523      PT SHORT TERM GOAL #1   Title Pt will be independent with initial HEP for strengthening, balance and flexibility to continue gains.    Time 4    Period Weeks    Status New    Target Date 10/24/20      PT SHORT TERM GOAL #2   Title Pt will decrease 5 x sit to stand from 13.64 sec to <11 sec for improved functional strength and balance.    Baseline 09/23/20 13.64 sec from chair with hands.    Time 4    Period Weeks    Status New    Target Date 10/24/20      PT SHORT TERM GOAL #3   Title FGA will be performed and LTG written to further assess balance and gait.    Time 4    Period Weeks    Status New    Target Date 10/24/20             PT Long Term Goals - 09/24/20 1526      PT LONG TERM GOAL #1   Title Pt will be independent with progressive HEP for strengthening, balance and flexibility to continue on own and transfer to gym program.    Time 8    Period Weeks    Status New    Target Date 11/23/20      PT LONG TERM GOAL #2   Title Pt will be able to perform 5 sit to stands without UE support from standard chair for improved functional strength.    Baseline unable to rise without hands on eval.    Time 8    Period Weeks    Status New    Target Date 11/23/20      PT LONG TERM GOAL #3   Title Pt will increase gait speed from 1.2734m/s to >1.376m/s for safe community ambulator speed.    Baseline 1.6734m/s on 09/23/20    Time 8    Period Weeks    Status New    Target Date 11/23/20      PT LONG TERM GOAL #4   Title Pt will be able to step up to 2nd step with minimal UE support for improved functional strength to aide with stepping off boat.     Time 8    Period Weeks    Status New    Target Date 11/23/20      PT LONG TERM GOAL #5   Title Pt will ambulate >1000' on varied surfaces including up/down curbs independently for improved community access.    Time 8    Period Weeks    Status New    Target Date 11/23/20                  Plan - 09/24/20 1512    Clinical Impression Statement Pt is 79 y/o male referred from Dr. Arbutus Leasat for diabetic peripheral neuropathy. Pt has noticed increased weakness last 6 months with rising from chairs and stepping up high being biggest issues. Has had 3 falls due  to this. Pt is fall risk based on 5 x sit to stand of 13.64 sec and does need UE support to rise showing decreased functional strength. His gait speed is just short of normal at 1.56m/s for community ambulator. He does present with weakness around hips bilateral left slightly worse than right of around 4/5. Lacking hip extension bilateral. Light touch sensation was intact in feet and legs. Pt will benefit from skilled PT to address strength, balance and functional mobility deficits to maximize function.    Examination-Activity Limitations Locomotion Level;Stairs;Transfers    Examination-Participation Restrictions Community Activity;Yard Work    Stability/Clinical Decision Making Stable/Uncomplicated    Clinical Decision Making Low    Rehab Potential Good    PT Frequency 2x / week   followed by 1x/week for 4 weeks. Plus eval   PT Duration 4 weeks    PT Treatment/Interventions ADLs/Self Care Home Management;DME Instruction;Gait training;Stair training;Functional mobility training;Therapeutic activities;Neuromuscular re-education;Balance training;Therapeutic exercise;Patient/family education;Manual techniques;Vestibular;Passive range of motion    PT Next Visit Plan Assess hip flexor muscle length. Assess FGA. PT will add goal as necessary next time seen. Initial HEP for hip strengthening as weak all around. Sit to stand, step-ups for functional  strengthening.    Consulted and Agree with Plan of Care Patient;Family member/caregiver    Family Member Consulted wife           Patient will benefit from skilled therapeutic intervention in order to improve the following deficits and impairments:  Abnormal gait,Decreased range of motion,Decreased strength,Decreased balance,Impaired flexibility,Difficulty walking  Visit Diagnosis: Other abnormalities of gait and mobility  Muscle weakness (generalized)     Problem List Patient Active Problem List   Diagnosis Date Noted  . Bilateral recurrent inguinal hernias 01/17/2016    Ronn Melena, PT, DPT, NCS 09/24/2020, 3:30 PM  [facility] [address] [place], [place], [address] Phone: [phone]   Fax:  [phone]  Name: Antonio Douglas MRN: [id number] Date of Birth: 1941/04/27

## 2020-09-24 NOTE — Addendum Note (Signed)
Addended by: Elmer Bales A on: 09/24/2020 03:32 PM   Modules accepted: Orders

## 2020-09-27 ENCOUNTER — Other Ambulatory Visit: Payer: Self-pay

## 2020-09-27 ENCOUNTER — Encounter: Payer: Self-pay | Admitting: Physical Therapy

## 2020-09-27 ENCOUNTER — Ambulatory Visit: Payer: Medicare Other | Admitting: Physical Therapy

## 2020-09-27 DIAGNOSIS — R2689 Other abnormalities of gait and mobility: Secondary | ICD-10-CM | POA: Diagnosis not present

## 2020-09-27 DIAGNOSIS — M6281 Muscle weakness (generalized): Secondary | ICD-10-CM | POA: Diagnosis not present

## 2020-09-27 NOTE — Patient Instructions (Signed)
Access Code: [email or web address] URL: [email or web address] Date: 09/27/2020 Prepared by: Sallyanne Kuster  Exercises Modified Maisie Fus Stretch - 1 x daily - 5 x weekly - 1 sets - 3 reps - 30 hold Sit to Stand with Arms Crossed - 1 x daily - 5 x weekly - 1 sets - 10 reps Tandem Walking with Counter Support - 1 x daily - 5 x weekly - 1 sets - 3 reps Walking with Eyes Closed and Counter Support - 1 x daily - 5 x weekly - 1 sets - 4 reps

## 2020-09-27 NOTE — Therapy (Signed)
[facility] [address] [place], [place], [address] Phone: [phone]   Fax:  [phone]  Physical Therapy Treatment  Patient Details  Name: Antonio Douglas MRN: [id number] Date of Birth: 11/02/1940 Referring Provider (PT): Kerin Salen   Encounter Date: 09/27/2020   PT End of Session - 09/27/20 1023    Visit Number 2    Number of Visits 13    Date for PT Re-Evaluation 12/23/20   60 day poc, 90 day cert   Authorization Type [employer]BS medicare (10th visit progress note)    PT Start Time 0932    PT Stop Time 1014    PT Time Calculation (min) 42 min    Equipment Utilized During Treatment Gait belt    Activity Tolerance Patient tolerated treatment well    Behavior During Therapy WFL for tasks assessed/performed           Past Medical History:  Diagnosis Date  . BPH (benign prostatic hyperplasia)    mild  . Diabetes mellitus without complication (HCC)   . Heart murmur   . Hypertension   . Pneumothorax    spontaneous    Past Surgical History:  Procedure Laterality Date  . CATARACT EXTRACTION, BILATERAL    . colonscopy  2016   dr Kinnie Scales  . INGUINAL HERNIA REPAIR Bilateral 01/17/2016   Procedure: LAPAROSCOPIC BILATERAL INGUINAL HERNIA REPAIR;  Surgeon: Ovidio Kin, MD;  Location: [facility];  Service: General;  Laterality: Bilateral;  With MESH    There were no vitals filed for this visit.   Subjective Assessment - 09/27/20 0936    Subjective No new complaints. No fall or pain to report. Does have some right sided low back soreness/tightness at times from a fall months ago.    Patient Stated Goals Pt would like to be able to stand up quickly.    Currently in Pain? No/denies              [facility] PT Assessment - 09/27/20 0938    Functional Gait  Assessment   Gait assessed  Yes    Gait Level Surface Walks 20 ft in less than 5.5 sec, no assistive devices, good speed, no evidence for imbalance, normal gait pattern,  deviates no more than 6 in outside of the 12 in walkway width.    Change in Gait Speed Able to smoothly change walking speed without loss of balance or gait deviation. Deviate no more than 6 in outside of the 12 in walkway width.    Gait with Horizontal Head Turns Performs head turns smoothly with no change in gait. Deviates no more than 6 in outside 12 in walkway width    Gait with Vertical Head Turns Performs task with slight change in gait velocity (eg, minor disruption to smooth gait path), deviates 6 - 10 in outside 12 in walkway width or uses assistive device    Gait and Pivot Turn Pivot turns safely in greater than 3 sec and stops with no loss of balance, or pivot turns safely within 3 sec and stops with mild imbalance, requires small steps to catch balance.    Step Over Obstacle Is able to step over one shoe box (4.5 in total height) without changing gait speed. No evidence of imbalance.    Gait with Narrow Base of Support Ambulates 7-9 steps.    Gait with Eyes Closed Cannot walk 20 ft without assistance, severe gait deviations or imbalance, deviates greater than 15 in outside 12 in  walkway width or will not attempt task.    Ambulating Backwards Walks 20 ft, slow speed, abnormal gait pattern, evidence for imbalance, deviates 10-15 in outside 12 in walkway width.    Steps Alternating feet, must use rail.    Total Score 20    FGA comment: 19-24 medium fall risk               [facility] Adult PT Treatment/Exercise - 09/27/20 0938      Transfers   Transfers Sit to Stand;Stand to Sit    Sit to Stand 6: Modified independent (Device/Increase time)    Stand to Sit 6: Modified independent (Device/Increase time)      Ambulation/Gait   Ambulation/Gait Yes    Ambulation/Gait Assistance 6: Modified independent (Device/Increase time)    Ambulation/Gait Assistance Details around gym with session/testing    Assistive device None    Gait Pattern Step-through pattern;Decreased step length -  right;Decreased step length - left    Ambulation Surface Level;Indoor      Self-Care   Self-Care Other Self-Care Comments    Other Self-Care Comments  in thomas position at edge of mat: right hip -10 degrees from neutral, left hip able to achieve neutral.      Exercises   Exercises Other Exercises    Other Exercises  educated on and issued HEP for strengthening and balance. Refer to [employer] program for full details. Min guard assist with balance ex's for safety with cues needed on correct form/technique with all ex's.          Issued the following to HEP today: Access Code: [email or web address] URL: [email or web address] Date: 09/27/2020 Prepared by: Sallyanne KusterKathy Janit Cutter  Exercises Modified Maisie Fushomas Stretch - 1 x daily - 5 x weekly - 1 sets - 3 reps - 30 hold Sit to Stand with Arms Crossed - 1 x daily - 5 x weekly - 1 sets - 10 reps Tandem Walking with Counter Support - 1 x daily - 5 x weekly - 1 sets - 3 reps Walking with Eyes Closed and Counter Support - 1 x daily - 5 x weekly - 1 sets - 4 reps   PT Education - 09/27/20 1014    Education Details results of FGA, initial HEP for strengthening and balance    Person(s) Educated Patient    Methods Explanation;Demonstration;Verbal cues;Handout    Comprehension Verbalized understanding;Returned demonstration;Verbal cues required;Need further instruction            PT Short Term Goals - 09/24/20 1523      PT SHORT TERM GOAL #1   Title Pt will be independent with initial HEP for strengthening, balance and flexibility to continue gains.    Time 4    Period Weeks    Status New    Target Date 10/24/20      PT SHORT TERM GOAL #2   Title Pt will decrease 5 x sit to stand from 13.64 sec to <11 sec for improved functional strength and balance.    Baseline 09/23/20 13.64 sec from chair with hands.    Time 4    Period Weeks    Status New    Target Date 10/24/20      PT SHORT TERM GOAL #3   Title FGA will be performed and LTG written to  further assess balance and gait.    Time 4    Period Weeks    Status New    Target Date 10/24/20  PT Long Term Goals - 09/24/20 1526      PT LONG TERM GOAL #1   Title Pt will be independent with progressive HEP for strengthening, balance and flexibility to continue on own and transfer to gym program.    Time 8    Period Weeks    Status New    Target Date 11/23/20      PT LONG TERM GOAL #2   Title Pt will be able to perform 5 sit to stands without UE support from standard chair for improved functional strength.    Baseline unable to rise without hands on eval.    Time 8    Period Weeks    Status New    Target Date 11/23/20      PT LONG TERM GOAL #3   Title Pt will increase gait speed from 1.90m/s to >1.60m/s for safe community ambulator speed.    Baseline 1.54m/s on 09/23/20    Time 8    Period Weeks    Status New    Target Date 11/23/20      PT LONG TERM GOAL #4   Title Pt will be able to step up to 2nd step with minimal UE support for improved functional strength to aide with stepping off boat.    Time 8    Period Weeks    Status New    Target Date 11/23/20      PT LONG TERM GOAL #5   Title Pt will ambulate >1000' on varied surfaces including up/down curbs independently for improved community access.    Time 8    Period Weeks    Status New    Target Date 11/23/20                 Plan - 09/27/20 1305    Clinical Impression Statement Today's skilled session initailly focused on baseline values for hip flexor tightness and Functional Gait Assessment. Pt noted to be tighter on right>left hip flexors. Pt scored in the medium fall risk category with FGA with score of 20/30 today. Remainder of session focused on establishement of an HEP to address strengthening and balance. No issues noted or reported in session. The pt is progressing toward goals and should benefit from continued PT to progress toward unmet goals.    Examination-Activity Limitations  Locomotion Level;Stairs;Transfers    Examination-Participation Restrictions Community Activity;Yard Work    Stability/Clinical Decision Making Stable/Uncomplicated    Rehab Potential Good    PT Frequency 2x / week   followed by 1x/week for 4 weeks. Plus eval   PT Duration 4 weeks    PT Treatment/Interventions ADLs/Self Care Home Management;DME Instruction;Gait training;Stair training;Functional mobility training;Therapeutic activities;Neuromuscular re-education;Balance training;Therapeutic exercise;Patient/family education;Manual techniques;Vestibular;Passive range of motion    PT Next Visit Plan PT will add goals for hip tightness and FGA. Review initial  HEP and add to it as indicated. Continue to work on hip strengthening and balance training. Pt is planning to transfer to [facility] location after next session as it is closer with smaller copay due to bills as not hospital based clinic.    PT Home Exercise Plan Access Code: [email or web address]    Consulted and Agree with Plan of Care Patient;Family member/caregiver    Family Member Consulted wife           Patient will benefit from skilled therapeutic intervention in order to improve the following deficits and impairments:  Abnormal gait,Decreased range of motion,Decreased strength,Decreased balance,Impaired flexibility,Difficulty walking  Visit Diagnosis:  Other abnormalities of gait and mobility  Muscle weakness (generalized)     Problem List Patient Active Problem List   Diagnosis Date Noted  . Bilateral recurrent inguinal hernias 01/17/2016    Sallyanne Kuster, PTA, [facility] [address], [address] [place], [place] [address] [phone] 09/27/20, 4:15 PM   Name: DAYLYN AZBILL MRN: [id number] Date of Birth: 1940-12-01

## 2020-09-30 ENCOUNTER — Ambulatory Visit: Payer: Medicare Other

## 2020-10-03 ENCOUNTER — Other Ambulatory Visit: Payer: Self-pay

## 2020-10-03 ENCOUNTER — Ambulatory Visit: Payer: Medicare Other

## 2020-10-03 DIAGNOSIS — R2689 Other abnormalities of gait and mobility: Secondary | ICD-10-CM

## 2020-10-03 DIAGNOSIS — B379 Candidiasis, unspecified: Secondary | ICD-10-CM | POA: Diagnosis not present

## 2020-10-03 DIAGNOSIS — M6281 Muscle weakness (generalized): Secondary | ICD-10-CM | POA: Diagnosis not present

## 2020-10-03 NOTE — Therapy (Signed)
[facility] [address] [place], [place], [address] Phone: [phone]   Fax:  [phone]  Physical Therapy Treatment  Patient Details  Name: Antonio Douglas MRN: [id number] Date of Birth: [birth year] Referring Provider (PT): Kerin Salen   Encounter Date: 10/03/2020   PT End of Session - 10/03/20 1016    Visit Number 3    Number of Visits 13    Date for PT Re-Evaluation 12/23/20   60 day poc, 90 day cert   Authorization Type BCBS medicare (10th visit progress note)    PT Start Time 1015    PT Stop Time 1057    PT Time Calculation (min) 42 min    Equipment Utilized During Treatment Gait belt    Activity Tolerance Patient tolerated treatment well    Behavior During Therapy WFL for tasks assessed/performed           Past Medical History:  Diagnosis Date  . BPH (benign prostatic hyperplasia)    mild  . Diabetes mellitus without complication (HCC)   . Heart murmur   . Hypertension   . Pneumothorax    spontaneous    Past Surgical History:  Procedure Laterality Date  . CATARACT EXTRACTION, BILATERAL    . colonscopy  2016   dr Kinnie Scales  . INGUINAL HERNIA REPAIR Bilateral 01/17/2016   Procedure: LAPAROSCOPIC BILATERAL INGUINAL HERNIA REPAIR;  Surgeon: Ovidio Kin, MD;  Location: [facility];  Service: General;  Laterality: Bilateral;  With MESH    There were no vitals filed for this visit.   Subjective Assessment - 10/03/20 1018    Subjective Pt reports that he has been doing the exercises and they are going well. He did take the weekend off as back was a little tight.    Patient Stated Goals Pt would like to be able to stand up quickly.    Currently in Pain? Yes    Pain Score 0-No pain    Pain Location Back    Pain Orientation Lower;Right    Pain Descriptors / Indicators Tightness    Pain Type Chronic pain    Pain Onset More than a month ago    Pain Frequency Intermittent                              [facility] Adult PT Treatment/Exercise - 10/03/20 1024      Transfers   Transfers Sit to Stand;Stand to Sit    Sit to Stand 6: Modified independent (Device/Increase time)    Stand to Sit 6: Modified independent (Device/Increase time)      Ambulation/Gait   Ambulation/Gait Yes    Ambulation/Gait Assistance 6: Modified independent (Device/Increase time)    Ambulation/Gait Assistance Details around in gym during session    Assistive device None    Gait Pattern Step-through pattern    Ambulation Surface Level;Indoor      Therapeutic Activites    Therapeutic Activities Other Therapeutic Activities    Other Therapeutic Activities Worked on floor transfers mimicking getting up from dock. Has rail on left side once on dock. Got down on mat on knees with mat elevated and performed to half kneeling with right foot up pushing with left hand on mat and right hand on right thigh x 3 supervision. Discussed possibly using step stool on floor of boat to allow him to step up on to dock versus having to crawl on to in first place.  Exercises   Exercises Other Exercises    Other Exercises  Right modified Thomas stretch off edge of mat with PT providing cues to keep back flat and also provided a litle pressure on thigh to increase stretch 30 sec x 2. Bridge x 10 with cues to tighten tummy first. No pain in back when added in tummy contraction to keep back flat. Sit to stand x 5 from mat without hands then with adding soft blue beam under feet x 10 getting balance each time. Step-ups on bottom 6" step x 10 each side without UE support, step-ups to 2nd step x 5 each with BUE support CGA. In // bars: Stepping on to rockerboard positioned ant/post with airex on top x 5 each leg with fingertip support. Standing on rockerboard with airex alternating taps on cone with fingertip support x 10. Stepping on rockerboard and stepping over x 5 bouts without UE support for most part. Side  stepping in // bar with red theraband around thighs over blue mat 6' x 6 without UE support then added in squat with each step close SBA 6' x 6. Pt was given verbal cues for form.                    PT Short Term Goals - 10/03/20 1227      PT SHORT TERM GOAL #1   Title Pt will be independent with initial HEP for strengthening, balance and flexibility to continue gains.    Time 4    Period Weeks    Status New    Target Date 10/24/20      PT SHORT TERM GOAL #2   Title Pt will decrease 5 x sit to stand from 13.64 sec to <11 sec for improved functional strength and balance.    Baseline 09/23/20 13.64 sec from chair with hands.    Time 4    Period Weeks    Status New    Target Date 10/24/20      PT SHORT TERM GOAL #3   Title FGA will be performed and LTG written to further assess balance and gait.    Baseline FGA was 20/30 on 09/27/20    Time 4    Period Weeks    Status Achieved    Target Date 10/24/20             PT Long Term Goals - 10/03/20 1227      PT LONG TERM GOAL #1   Title Pt will be independent with progressive HEP for strengthening, balance and flexibility to continue on own and transfer to gym program.    Time 8    Period Weeks    Status New      PT LONG TERM GOAL #2   Title Pt will be able to perform 5 sit to stands without UE support from standard chair for improved functional strength.    Baseline unable to rise without hands on eval.    Time 8    Period Weeks    Status New      PT LONG TERM GOAL #3   Title Pt will increase gait speed from 1.40m/s to >1.44m/s for safe community ambulator speed.    Baseline 1.23m/s on 09/23/20    Time 8    Period Weeks    Status New      PT LONG TERM GOAL #4   Title Pt will be able to step up to 2nd step with minimal UE support for improved  functional strength to aide with stepping off boat.    Time 8    Period Weeks    Status New      PT LONG TERM GOAL #5   Title Pt will ambulate >1000' on varied  surfaces including up/down curbs independently for improved community access.    Time 8    Period Weeks    Status New      Additional Long Term Goals   Additional Long Term Goals Yes      PT LONG TERM GOAL #6   Title Pt will increase FGA from 20 to 24/30 or more for improved balance and gait safety.    Baseline 09/27/20 20/30    Time 8    Period Weeks    Status New    Target Date 11/23/20                 Plan - 10/03/20 1235    Clinical Impression Statement PT continued to focus more on functional strengthening activities to help mimmick his boat transfers that are a challenge. Pt most challenged with negotiating a high step needing UE support and coming up from half kneel. He did need some UE support as well when added in compliant surfaces. Added FGA goal to address higher level balance/gait deficits.    Examination-Activity Limitations Locomotion Level;Stairs;Transfers    Examination-Participation Restrictions Community Activity;Yard Work    Stability/Clinical Decision Making Stable/Uncomplicated    Rehab Potential Good    PT Frequency 2x / week   followed by 1x/week for 4 weeks. Plus eval   PT Duration 4 weeks    PT Treatment/Interventions ADLs/Self Care Home Management;DME Instruction;Gait training;Stair training;Functional mobility training;Therapeutic activities;Neuromuscular re-education;Balance training;Therapeutic exercise;Patient/family education;Manual techniques;Vestibular;Passive range of motion    PT Next Visit Plan Continue to work on hip strengthening and balance training. Right hip flexor stretching. Functional training with steps and floor transfers as is challenged with trying to step out of boat on to dock. Pt is transferring to [facility] location after next session as it is closer with smaller copay due to bills as not hospital based clinic.    PT Home Exercise Plan Access Code: [email or web address]    Consulted and Agree with Plan of Care Patient;Family  member/caregiver    Family Member Consulted wife           Patient will benefit from skilled therapeutic intervention in order to improve the following deficits and impairments:  Abnormal gait,Decreased range of motion,Decreased strength,Decreased balance,Impaired flexibility,Difficulty walking  Visit Diagnosis: Other abnormalities of gait and mobility  Muscle weakness (generalized)     Problem List Patient Active Problem List   Diagnosis Date Noted  . Bilateral recurrent inguinal hernias 01/17/2016    Ronn Melena, PT, DPT, NCS 10/03/2020, 12:43 PM  [facility] [address] [place], [place], [address] Phone: [phone]   Fax:  [phone]  Name: Antonio Douglas MRN: [id number] Date of Birth: 05-Apr-1941

## 2020-10-03 NOTE — Patient Instructions (Signed)
Access Code: [email or web address] URL: [email or web address] Date: 10/03/2020 Prepared by: Elmer Bales  Exercises Modified Maisie Fus Stretch - 1 x daily - 5 x weekly - 1 sets - 3 reps - 30 hold Sit to Stand with Arms Crossed - 1 x daily - 5 x weekly - 1 sets - 10 reps Tandem Walking with Counter Support - 1 x daily - 5 x weekly - 1 sets - 3 reps Walking with Eyes Closed and Counter Support - 1 x daily - 5 x weekly - 1 sets - 4 reps Supine Bridge - 1 x daily - 5 x weekly - 2 sets - 10 reps

## 2020-10-05 ENCOUNTER — Ambulatory Visit: Payer: Medicare Other | Admitting: Physical Therapy

## 2020-10-11 ENCOUNTER — Ambulatory Visit: Payer: Medicare Other | Admitting: Physical Therapy

## 2020-10-12 ENCOUNTER — Other Ambulatory Visit: Payer: Self-pay

## 2020-10-12 ENCOUNTER — Ambulatory Visit: Payer: Medicare Other | Admitting: Physical Therapy

## 2020-10-12 ENCOUNTER — Encounter: Payer: Self-pay | Admitting: Physical Therapy

## 2020-10-12 DIAGNOSIS — R2689 Other abnormalities of gait and mobility: Secondary | ICD-10-CM

## 2020-10-13 ENCOUNTER — Ambulatory Visit: Payer: Medicare Other | Admitting: Physical Therapy

## 2020-10-16 ENCOUNTER — Encounter: Payer: Self-pay | Admitting: Physical Therapy

## 2020-10-16 NOTE — Therapy (Signed)
[facility] [address] [place], [place], [address] Phone: [phone]   Fax:  [phone]  Physical Therapy Treatment  Patient Details  Name: Antonio Douglas MRN: [id number] Date of Birth: 12/11/1940 Referring Provider (PT): Kerin Salen   Encounter Date: 10/12/2020   PT End of Session - 10/16/20 2205    Visit Number 4    Number of Visits 13    Date for PT Re-Evaluation 12/23/20   60 day poc, 90 day cert   Authorization Type BCBS medicare (10th visit progress note)    PT Start Time 1100    PT Stop Time 1143    PT Time Calculation (min) 43 min    Equipment Utilized During Treatment Gait belt    Activity Tolerance Patient tolerated treatment well    Behavior During Therapy [facility] for tasks assessed/performed           Past Medical History:  Diagnosis Date  . BPH (benign prostatic hyperplasia)    mild  . Diabetes mellitus without complication (HCC)   . Heart murmur   . Hypertension   . Pneumothorax    spontaneous    Past Surgical History:  Procedure Laterality Date  . CATARACT EXTRACTION, BILATERAL    . colonscopy  2016   dr Kinnie Scales  . INGUINAL HERNIA REPAIR Bilateral 01/17/2016   Procedure: LAPAROSCOPIC BILATERAL INGUINAL HERNIA REPAIR;  Surgeon: Ovidio Kin, MD;  Location: [facility];  Service: General;  Laterality: Bilateral;  With MESH    There were no vitals filed for this visit.   Subjective Assessment - 10/16/20 2205    Subjective Pt transfered care from Neuro clinic. Doing well with his exercises.Has been practicing mostly balance work.    Patient Stated Goals Pt would like to be able to stand up quickly.    Currently in Pain? No/denies                             [facility] Adult PT Treatment/Exercise - 10/16/20 2208      Exercises   Exercises Other Exercises;Knee/Hip      Knee/Hip Exercises: Stretches   Other Knee/Hip Stretches standing hip flexor stretch 30 sec x 3;    Other Knee/Hip Stretches  Supine: SKTC 30 sec x 3 bil;      Knee/Hip Exercises: Aerobic   Recumbent Bike L1 x 6 min;      Knee/Hip Exercises: Standing   Hip Flexion 20 reps    Hip Abduction 20 reps;Both    Forward Step Up 15 reps;Step Height: 6";Hand Hold: 1    Stairs 5 steps , 6 in x 5; 1 HR    SLS 15 sec x 2 bil;    Other Standing Knee Exercises Tandem stance 30 sec x 2 bil;    Other Standing Knee Exercises Tandem walk 10 ft x 4;  Walk/March 10 ft x 4; L/R and A/P weight shifts on AirEx      Knee/Hip Exercises: Supine   Bridges 20 reps                    PT Short Term Goals - 10/03/20 1227      PT SHORT TERM GOAL #1   Title Pt will be independent with initial HEP for strengthening, balance and flexibility to continue gains.    Time 4    Period Weeks    Status New    Target Date 10/24/20  PT SHORT TERM GOAL #2   Title Pt will decrease 5 x sit to stand from 13.64 sec to <11 sec for improved functional strength and balance.    Baseline 09/23/20 13.64 sec from chair with hands.    Time 4    Period Weeks    Status New    Target Date 10/24/20      PT SHORT TERM GOAL #3   Title FGA will be performed and LTG written to further assess balance and gait.    Baseline FGA was 20/30 on 09/27/20    Time 4    Period Weeks    Status Achieved    Target Date 10/24/20             PT Long Term Goals - 10/03/20 1227      PT LONG TERM GOAL #1   Title Pt will be independent with progressive HEP for strengthening, balance and flexibility to continue on own and transfer to gym program.    Time 8    Period Weeks    Status New      PT LONG TERM GOAL #2   Title Pt will be able to perform 5 sit to stands without UE support from standard chair for improved functional strength.    Baseline unable to rise without hands on eval.    Time 8    Period Weeks    Status New      PT LONG TERM GOAL #3   Title Pt will increase gait speed from 1.28m/s to >1.24m/s for safe community ambulator speed.     Baseline 1.72m/s on 09/23/20    Time 8    Period Weeks    Status New      PT LONG TERM GOAL #4   Title Pt will be able to step up to 2nd step with minimal UE support for improved functional strength to aide with stepping off boat.    Time 8    Period Weeks    Status New      PT LONG TERM GOAL #5   Title Pt will ambulate >1000' on varied surfaces including up/down curbs independently for improved community access.    Time 8    Period Weeks    Status New      Additional Long Term Goals   Additional Long Term Goals Yes      PT LONG TERM GOAL #6   Title Pt will increase FGA from 20 to 24/30 or more for improved balance and gait safety.    Baseline 09/27/20 20/30    Time 8    Period Weeks    Status New    Target Date 11/23/20                 Plan - 10/16/20 2218    Clinical Impression Statement Pt challenged wtih balance exercises today, more with dynamic vs static balance, will continue to progress difficulty of activities. Updated HEP, and reviewed LE strenth today.    Examination-Activity Limitations Locomotion Level;Stairs;Transfers    Examination-Participation Restrictions Community Activity;Yard Work    Stability/Clinical Decision Making Stable/Uncomplicated    Rehab Potential Good    PT Frequency 2x / week   followed by 1x/week for 4 weeks. Plus eval   PT Duration 4 weeks    PT Treatment/Interventions ADLs/Self Care Home Management;DME Instruction;Gait training;Stair training;Functional mobility training;Therapeutic activities;Neuromuscular re-education;Balance training;Therapeutic exercise;Patient/family education;Manual techniques;Vestibular;Passive range of motion    PT Next Visit Plan Continue to work on hip strengthening  and balance training. Right hip flexor stretching. Functional training with steps and floor transfers as is challenged with trying to step out of boat on to dock. Pt is transferring to [place] location after next session as it is closer  with smaller copay due to bills as not hospital based clinic.    PT Home Exercise Plan Access Code: [email or web address]    Consulted and Agree with Plan of Care Patient;Family member/caregiver    Family Member Consulted wife           Patient will benefit from skilled therapeutic intervention in order to improve the following deficits and impairments:  Abnormal gait,Decreased range of motion,Decreased strength,Decreased balance,Impaired flexibility,Difficulty walking  Visit Diagnosis: Other abnormalities of gait and mobility     Problem List Patient Active Problem List   Diagnosis Date Noted  . Bilateral recurrent inguinal hernias 01/17/2016    Lyndee Hensen, PT, DPT 10:19 PM  10/16/20    [place], [place], [address] Phone: [phone]   Fax:  [phone]  Name: Antonio Douglas MRN: [id number] Date of Birth: [birth year]

## 2020-10-17 ENCOUNTER — Encounter: Payer: Self-pay | Admitting: Physical Therapy

## 2020-10-17 ENCOUNTER — Other Ambulatory Visit: Payer: Self-pay

## 2020-10-17 ENCOUNTER — Ambulatory Visit (INDEPENDENT_AMBULATORY_CARE_PROVIDER_SITE_OTHER): Payer: Medicare Other | Admitting: Podiatry

## 2020-10-17 ENCOUNTER — Ambulatory Visit: Payer: Medicare Other | Admitting: Physical Therapy

## 2020-10-17 ENCOUNTER — Encounter: Payer: Self-pay | Admitting: Podiatry

## 2020-10-17 DIAGNOSIS — L03031 Cellulitis of right toe: Secondary | ICD-10-CM | POA: Diagnosis not present

## 2020-10-17 DIAGNOSIS — R2689 Other abnormalities of gait and mobility: Secondary | ICD-10-CM

## 2020-10-17 NOTE — Therapy (Signed)
[place] [address] [place], [place], [address] Phone: [phone]   Fax:  [phone]  Physical Therapy Treatment  Patient Details  Name: Antonio Douglas MRN: [id number] Date of Birth: 1941-05-04 Referring Provider (PT): Alonza Bogus   Encounter Date: 10/17/2020   PT End of Session - 10/17/20 1105    Visit Number 5    Number of Visits 13    Date for PT Re-Evaluation 12/23/20   60 day poc, 90 day cert   Authorization Type BCBS medicare (10th visit progress note)    PT Start Time 1100    PT Stop Time 1145    PT Time Calculation (min) 45 min    Equipment Utilized During Treatment Gait belt    Activity Tolerance Patient tolerated treatment well    Behavior During Therapy [facility] for tasks assessed/performed           Past Medical History:  Diagnosis Date  . BPH (benign prostatic hyperplasia)    mild  . Diabetes mellitus without complication ([place])   . Heart murmur   . Hypertension   . Pneumothorax    spontaneous    Past Surgical History:  Procedure Laterality Date  . CATARACT EXTRACTION, BILATERAL    . colonscopy  2016   dr Earlean Shawl  . INGUINAL HERNIA REPAIR Bilateral 01/17/2016   Procedure: LAPAROSCOPIC BILATERAL INGUINAL HERNIA REPAIR;  Surgeon: Alphonsa Overall, MD;  Location: [facility];  Service: General;  Laterality: Bilateral;  With MESH    There were no vitals filed for this visit.   Subjective Assessment - 10/17/20 1054    Subjective Pt with no new complaints of balance. Does think he has infected toenail from pedicure.    Currently in Pain? No/denies    Pain Score 0-No pain                             [facility] Adult PT Treatment/Exercise - 10/17/20 0001      Exercises   Exercises Other Exercises;Knee/Hip      Knee/Hip Exercises: Stretches   Other Knee/Hip Stretches --    Other Knee/Hip Stretches Supine: SKTC 30 sec x 3 bil;      Knee/Hip Exercises: Aerobic   Recumbent Bike L1 x 8 min;       Knee/Hip Exercises: Standing   Hip Flexion 20 reps    Hip Abduction 20 reps;Both    Forward Step Up Step Height: 6";Hand Hold: 1;10 reps    Forward Step Up Limitations x5 1 HR , 8 in bil;    Stairs 5 steps , 6 in x 4; 1 HR, then x 3 with no UE support    SLS 15 sec x 2 bil;    Other Standing Knee Exercises Tandem stance 30 sec x 2 bil; with head notd x 10 bil;  Toe taps on 6 in step. Side stepping, bwd waking 15 ft x 4 ea;    Other Standing Knee Exercises Walk/March 10 ft x 4; L/R and A/P weight shifts on AirEx; Rhomberg stance, head nods and L/R head turns x 15 ea;      Knee/Hip Exercises: Seated   Sit to Sand 5 reps      Knee/Hip Exercises: Supine   Bridges 20 reps                    PT Short Term Goals - 10/03/20 1227      PT SHORT  TERM GOAL #1   Title Pt will be independent with initial HEP for strengthening, balance and flexibility to continue gains.    Time 4    Period Weeks    Status New    Target Date 10/24/20      PT SHORT TERM GOAL #2   Title Pt will decrease 5 x sit to stand from 13.64 sec to <11 sec for improved functional strength and balance.    Baseline 09/23/20 13.64 sec from chair with hands.    Time 4    Period Weeks    Status New    Target Date 10/24/20      PT SHORT TERM GOAL #3   Title FGA will be performed and LTG written to further assess balance and gait.    Baseline FGA was 20/30 on 09/27/20    Time 4    Period Weeks    Status Achieved    Target Date 10/24/20             PT Long Term Goals - 10/03/20 1227      PT LONG TERM GOAL #1   Title Pt will be independent with progressive HEP for strengthening, balance and flexibility to continue on own and transfer to gym program.    Time 8    Period Weeks    Status New      PT LONG TERM GOAL #2   Title Pt will be able to perform 5 sit to stands without UE support from standard chair for improved functional strength.    Baseline unable to rise without hands on eval.    Time 8     Period Weeks    Status New      PT LONG TERM GOAL #3   Title Pt will increase gait speed from 1.22m/s to >1.72m/s for safe community ambulator speed.    Baseline 1.60m/s on 09/23/20    Time 8    Period Weeks    Status New      PT LONG TERM GOAL #4   Title Pt will be able to step up to 2nd step with minimal UE support for improved functional strength to aide with stepping off boat.    Time 8    Period Weeks    Status New      PT LONG TERM GOAL #5   Title Pt will ambulate >1000' on varied surfaces including up/down curbs independently for improved community access.    Time 8    Period Weeks    Status New      Additional Long Term Goals   Additional Long Term Goals Yes      PT LONG TERM GOAL #6   Title Pt will increase FGA from 20 to 24/30 or more for improved balance and gait safety.    Baseline 09/27/20 20/30    Time 8    Period Weeks    Status New    Target Date 11/23/20                 Plan - 10/17/20 1148    Clinical Impression Statement Pt able to progress balance activities, CGA required. Pt with diffiuclty with higher step up, req pull with UE vs just support. Plan to progress as tolerated.    Examination-Activity Limitations Locomotion Level;Stairs;Transfers    Examination-Participation Restrictions Community Activity;Yard Work    Stability/Clinical Decision Making Stable/Uncomplicated    Rehab Potential Good    PT Frequency 2x / week   followed by  1x/week for 4 weeks. Plus eval   PT Duration 4 weeks    PT Treatment/Interventions ADLs/Self Care Home Management;DME Instruction;Gait training;Stair training;Functional mobility training;Therapeutic activities;Neuromuscular re-education;Balance training;Therapeutic exercise;Patient/family education;Manual techniques;Vestibular;Passive range of motion    PT Next Visit Plan Continue to work on hip strengthening and balance training. Right hip flexor stretching. Functional training with steps and floor transfers as is  challenged with trying to step out of boat on to dock. Pt is transferring to [facility] location after next session as it is closer with smaller copay due to bills as not hospital based clinic.    PT Home Exercise Plan Access Code: [email or web address]    Consulted and Agree with Plan of Care Patient;Family member/caregiver    Family Member Consulted wife           Patient will benefit from skilled therapeutic intervention in order to improve the following deficits and impairments:  Abnormal gait,Decreased range of motion,Decreased strength,Decreased balance,Impaired flexibility,Difficulty walking  Visit Diagnosis: Other abnormalities of gait and mobility     Problem List Patient Active Problem List   Diagnosis Date Noted  . Bilateral recurrent inguinal hernias 01/17/2016   Sedalia Muta, PT, DPT 11:49 AM  10/17/20    [facility] [address] [place], [place], [address] Phone: [phone]   Fax:  [phone]  Name: Antonio Douglas MRN: [id number] Date of Birth: 08-Jul-1941

## 2020-10-18 ENCOUNTER — Ambulatory Visit: Payer: Medicare Other

## 2020-10-18 NOTE — Progress Notes (Signed)
Subjective:   Patient ID: Antonio Douglas, male   DOB: 80 y.o.   MRN: [id number]   HPI Patient states he is developed redness around his big toe right and states that it is been sore and it was draining.  He has been soaking it   ROS      Objective:  Physical Exam  Neurovascular status intact negative Denna Haggard' sign noted right hallux lateral side found to be red and incurvated with distal yellowness no active drainage noted but very painful     Assessment:  Probability for paronychia infection right hallux with history of going to pedicure     Plan:  H&P reviewed condition sterile prep done and anesthetized the right hallux and sterile prep upon applied.  I then with sterile instrumentation remove the lateral border I cleaned out necrotic tissue abscess tissue flushed the area applied sterile dressing instructed on soaks and reappoint if redness persists.  This should heal uneventfully

## 2020-10-20 ENCOUNTER — Other Ambulatory Visit: Payer: Self-pay

## 2020-10-20 ENCOUNTER — Ambulatory Visit (INDEPENDENT_AMBULATORY_CARE_PROVIDER_SITE_OTHER): Payer: Medicare Other | Admitting: Physical Therapy

## 2020-10-20 ENCOUNTER — Ambulatory Visit: Payer: Medicare Other

## 2020-10-20 ENCOUNTER — Encounter: Payer: Self-pay | Admitting: Physical Therapy

## 2020-10-20 DIAGNOSIS — R2689 Other abnormalities of gait and mobility: Secondary | ICD-10-CM | POA: Diagnosis not present

## 2020-10-22 ENCOUNTER — Encounter: Payer: Self-pay | Admitting: Physical Therapy

## 2020-10-22 NOTE — Therapy (Signed)
[facility] [address] [place], [place], [address] Phone: [phone]   Fax:  [phone]  Physical Therapy Treatment  Patient Details  Name: Antonio Douglas MRN: [id number] Date of Birth: [birth year] Referring Provider (PT): Kerin Salen   Encounter Date: 10/20/2020   PT End of Session - 10/22/20 1416    Visit Number 6    Number of Visits 13    Date for PT Re-Evaluation 12/23/20   60 day poc, 90 day cert   Authorization Type BCBS medicare (10th visit progress note)    PT Start Time 1345    PT Stop Time 1429    PT Time Calculation (min) 44 min    Equipment Utilized During Treatment Gait belt    Activity Tolerance Patient tolerated treatment well    Behavior During Therapy [facility] for tasks assessed/performed           Past Medical History:  Diagnosis Date  . BPH (benign prostatic hyperplasia)    mild  . Diabetes mellitus without complication (HCC)   . Heart murmur   . Hypertension   . Pneumothorax    spontaneous    Past Surgical History:  Procedure Laterality Date  . CATARACT EXTRACTION, BILATERAL    . colonscopy  2016   dr Kinnie Scales  . INGUINAL HERNIA REPAIR Bilateral 01/17/2016   Procedure: LAPAROSCOPIC BILATERAL INGUINAL HERNIA REPAIR;  Surgeon: Ovidio Kin, MD;  Location: [facility];  Service: General;  Laterality: Bilateral;  With MESH    There were no vitals filed for this visit.   Subjective Assessment - 10/22/20 1416    Subjective Pt with no new complaints. He did get toe treated at Dr office this week.    Currently in Pain? No/denies                             [facility] Adult PT Treatment/Exercise - 10/22/20 0001      Exercises   Exercises Other Exercises;Knee/Hip      Knee/Hip Exercises: Stretches   Active Hamstring Stretch 3 reps;30 seconds    Active Hamstring Stretch Limitations seated      Knee/Hip Exercises: Aerobic   Recumbent Bike L1 x 8 min;      Knee/Hip Exercises: Standing   Hip  Flexion 20 reps    Hip Abduction 20 reps;Both    Forward Step Up Step Height: 6";10 reps;Hand Hold: 0    Forward Step Up Limitations x5 1 HR , 8 in bil;    Stairs 5 steps , 6 in ; x 3 with no UE support    Other Standing Knee Exercises Tandem stance 30 sec x 2 bil; with head nod and L/R turns x 10 bil;  Toe taps on 6 in step.    Other Standing Knee Exercises AirEx: t-bands: UE ext x 20;  Fwd punch x 20, Torso turns x10;      Knee/Hip Exercises: Seated   Sit to Sand 10 reps;1 set;without UE support                    PT Short Term Goals - 10/03/20 1227      PT SHORT TERM GOAL #1   Title Pt will be independent with initial HEP for strengthening, balance and flexibility to continue gains.    Time 4    Period Weeks    Status New    Target Date 10/24/20      PT  SHORT TERM GOAL #2   Title Pt will decrease 5 x sit to stand from 13.64 sec to <11 sec for improved functional strength and balance.    Baseline 09/23/20 13.64 sec from chair with hands.    Time 4    Period Weeks    Status New    Target Date 10/24/20      PT SHORT TERM GOAL #3   Title FGA will be performed and LTG written to further assess balance and gait.    Baseline FGA was 20/30 on 09/27/20    Time 4    Period Weeks    Status Achieved    Target Date 10/24/20             PT Long Term Goals - 10/03/20 1227      PT LONG TERM GOAL #1   Title Pt will be independent with progressive HEP for strengthening, balance and flexibility to continue on own and transfer to gym program.    Time 8    Period Weeks    Status New      PT LONG TERM GOAL #2   Title Pt will be able to perform 5 sit to stands without UE support from standard chair for improved functional strength.    Baseline unable to rise without hands on eval.    Time 8    Period Weeks    Status New      PT LONG TERM GOAL #3   Title Pt will increase gait speed from 1.61m/s to >1.47m/s for safe community ambulator speed.    Baseline 1.75m/s on  09/23/20    Time 8    Period Weeks    Status New      PT LONG TERM GOAL #4   Title Pt will be able to step up to 2nd step with minimal UE support for improved functional strength to aide with stepping off boat.    Time 8    Period Weeks    Status New      PT LONG TERM GOAL #5   Title Pt will ambulate >1000' on varied surfaces including up/down curbs independently for improved community access.    Time 8    Period Weeks    Status New      Additional Long Term Goals   Additional Long Term Goals Yes      PT LONG TERM GOAL #6   Title Pt will increase FGA from 20 to 24/30 or more for improved balance and gait safety.    Baseline 09/27/20 20/30    Time 8    Period Weeks    Status New    Target Date 11/23/20                 Plan - 10/22/20 1419    Clinical Impression Statement Pt progressing well. Likely d/c in next 2 weeks. Pt still having difficulty with higher step, requireing more UE support. He is improving with regular stair height, with less UE support needed.    Examination-Activity Limitations Locomotion Level;Stairs;Transfers    Examination-Participation Restrictions Community Activity;Yard Work    Stability/Clinical Decision Making Stable/Uncomplicated    Rehab Potential Good    PT Frequency 2x / week   followed by 1x/week for 4 weeks. Plus eval   PT Duration 4 weeks    PT Treatment/Interventions ADLs/Self Care Home Management;DME Instruction;Gait training;Stair training;Functional mobility training;Therapeutic activities;Neuromuscular re-education;Balance training;Therapeutic exercise;Patient/family education;Manual techniques;Vestibular;Passive range of motion    PT Next Visit Plan Continue  to work on hip strengthening and balance training. Right hip flexor stretching. Functional training with steps and floor transfers as is challenged with trying to step out of boat on to dock. Pt is transferring to [facility] location after next session as it is closer with  smaller copay due to bills as not hospital based clinic.    PT Home Exercise Plan Access Code: [email or web address]    Consulted and Agree with Plan of Care Patient;Family member/caregiver    Family Member Consulted wife           Patient will benefit from skilled therapeutic intervention in order to improve the following deficits and impairments:  Abnormal gait,Decreased range of motion,Decreased strength,Decreased balance,Impaired flexibility,Difficulty walking  Visit Diagnosis: Other abnormalities of gait and mobility     Problem List Patient Active Problem List   Diagnosis Date Noted  . Bilateral recurrent inguinal hernias 01/17/2016    Sedalia Muta, PT, DPT 2:22 PM  10/22/20    [place] [facility] [address] [place], [place], [address] Phone: [phone]   Fax:  [phone]  Name: Antonio Douglas MRN: [id number] Date of Birth: Jul 29, 1941

## 2020-10-24 ENCOUNTER — Other Ambulatory Visit: Payer: Self-pay

## 2020-10-24 ENCOUNTER — Ambulatory Visit (INDEPENDENT_AMBULATORY_CARE_PROVIDER_SITE_OTHER): Payer: Medicare Other | Admitting: Physical Therapy

## 2020-10-24 ENCOUNTER — Encounter: Payer: Self-pay | Admitting: Physical Therapy

## 2020-10-24 DIAGNOSIS — R2689 Other abnormalities of gait and mobility: Secondary | ICD-10-CM

## 2020-10-24 NOTE — Therapy (Signed)
[facility] [place] [facility] [address] [place], [place], [address] Phone: [phone]   Fax:  [phone]  Physical Therapy Treatment  Patient Details  Name: Antonio Douglas MRN: [id number] Date of Birth: April 18, 1941 Referring Provider (PT): Kerin Salen   Encounter Date: 10/24/2020   PT End of Session - 10/24/20 1027    Visit Number 7    Number of Visits 13    Date for PT Re-Evaluation 12/23/20   60 day poc, 90 day cert   Authorization Type BCBS medicare (10th visit progress note)    PT Start Time 1023    PT Stop Time 1103    PT Time Calculation (min) 40 min    Equipment Utilized During Treatment Gait belt    Activity Tolerance Patient tolerated treatment well    Behavior During Therapy [facility] for tasks assessed/performed           Past Medical History:  Diagnosis Date  . BPH (benign prostatic hyperplasia)    mild  . Diabetes mellitus without complication (HCC)   . Heart murmur   . Hypertension   . Pneumothorax    spontaneous    Past Surgical History:  Procedure Laterality Date  . CATARACT EXTRACTION, BILATERAL    . colonscopy  2016   dr Kinnie Scales  . INGUINAL HERNIA REPAIR Bilateral 01/17/2016   Procedure: LAPAROSCOPIC BILATERAL INGUINAL HERNIA REPAIR;  Surgeon: Ovidio Kin, MD;  Location: [facility];  Service: General;  Laterality: Bilateral;  With MESH    There were no vitals filed for this visit.   Subjective Assessment - 10/24/20 1027    Subjective Pt with no new complaints.    Currently in Pain? No/denies    Pain Score 0-No pain                             [facility] Adult PT Treatment/Exercise - 10/24/20 0001      Exercises   Exercises Other Exercises;Knee/Hip      Knee/Hip Exercises: Stretches   Active Hamstring Stretch 3 reps;30 seconds    Active Hamstring Stretch Limitations seated      Knee/Hip Exercises: Aerobic   Recumbent Bike L1 x 7 min;      Knee/Hip Exercises: Standing   Hip Flexion 20 reps    Hip  Abduction --    Forward Step Up Step Height: 6";10 reps;Hand Hold: 0    Forward Step Up Limitations x5 1 HR , 8 in bil;    Stairs 5 steps , 6 in ; x 3 with no UE support    Other Standing Knee Exercises Tandem stance 30 sec x 2 bil; with head nod and L/R turns x 10 bil;  Toe taps on 6 in step;  L/R and bwd stepping with weight shifts.    Other Standing Knee Exercises Lateral and post stepping w weight shifts x 20 each.      Knee/Hip Exercises: Seated   Long Arc Quad 20 reps;Both    Long Arc Quad Weight 4 lbs.    Sit to Sand 10 reps;1 set;without UE support                    PT Short Term Goals - 10/03/20 1227      PT SHORT TERM GOAL #1   Title Pt will be independent with initial HEP for strengthening, balance and flexibility to continue gains.    Time 4    Period Weeks  Status New    Target Date 10/24/20      PT SHORT TERM GOAL #2   Title Pt will decrease 5 x sit to stand from 13.64 sec to <11 sec for improved functional strength and balance.    Baseline 09/23/20 13.64 sec from chair with hands.    Time 4    Period Weeks    Status New    Target Date 10/24/20      PT SHORT TERM GOAL #3   Title FGA will be performed and LTG written to further assess balance and gait.    Baseline FGA was 20/30 on 09/27/20    Time 4    Period Weeks    Status Achieved    Target Date 10/24/20             PT Long Term Goals - 10/03/20 1227      PT LONG TERM GOAL #1   Title Pt will be independent with progressive HEP for strengthening, balance and flexibility to continue on own and transfer to gym program.    Time 8    Period Weeks    Status New      PT LONG TERM GOAL #2   Title Pt will be able to perform 5 sit to stands without UE support from standard chair for improved functional strength.    Baseline unable to rise without hands on eval.    Time 8    Period Weeks    Status New      PT LONG TERM GOAL #3   Title Pt will increase gait speed from 1.82m/s to >1.60m/s for  safe community ambulator speed.    Baseline 1.21m/s on 09/23/20    Time 8    Period Weeks    Status New      PT LONG TERM GOAL #4   Title Pt will be able to step up to 2nd step with minimal UE support for improved functional strength to aide with stepping off boat.    Time 8    Period Weeks    Status New      PT LONG TERM GOAL #5   Title Pt will ambulate >1000' on varied surfaces including up/down curbs independently for improved community access.    Time 8    Period Weeks    Status New      Additional Long Term Goals   Additional Long Term Goals Yes      PT LONG TERM GOAL #6   Title Pt will increase FGA from 20 to 24/30 or more for improved balance and gait safety.    Baseline 09/27/20 20/30    Time 8    Period Weeks    Status New    Target Date 11/23/20                 Plan - 10/24/20 1300    Clinical Impression Statement Pt showing good progress with strength and stability. Likely d/c at end of this week. Will finalize HEP    Examination-Activity Limitations Locomotion Level;Stairs;Transfers    Examination-Participation Restrictions Community Activity;Yard Work    Stability/Clinical Decision Making Stable/Uncomplicated    Rehab Potential Good    PT Frequency 2x / week   followed by 1x/week for 4 weeks. Plus eval   PT Duration 4 weeks    PT Treatment/Interventions ADLs/Self Care Home Management;DME Instruction;Gait training;Stair training;Functional mobility training;Therapeutic activities;Neuromuscular re-education;Balance training;Therapeutic exercise;Patient/family education;Manual techniques;Vestibular;Passive range of motion    PT Next Visit Plan Continue  to work on hip strengthening and balance training. Right hip flexor stretching. Functional training with steps and floor transfers as is challenged with trying to step out of boat on to dock. Pt is transferring to [facility] location after next session as it is closer with smaller copay due to bills as not  hospital based clinic.    PT Home Exercise Plan Access Code: [email or web address]    Consulted and Agree with Plan of Care Patient;Family member/caregiver    Family Member Consulted wife           Patient will benefit from skilled therapeutic intervention in order to improve the following deficits and impairments:  Abnormal gait,Decreased range of motion,Decreased strength,Decreased balance,Impaired flexibility,Difficulty walking  Visit Diagnosis: Other abnormalities of gait and mobility     Problem List Patient Active Problem List   Diagnosis Date Noted  . Bilateral recurrent inguinal hernias 01/17/2016    Sedalia Muta, PT, DPT 1:02 PM  10/24/20   [facility] [address] [place], [place], [address] Phone: [phone]   Fax:  [phone]  Name: Antonio Douglas MRN: [id number] Date of Birth: [birth year]

## 2020-10-27 ENCOUNTER — Other Ambulatory Visit: Payer: Self-pay

## 2020-10-27 ENCOUNTER — Ambulatory Visit: Payer: Medicare Other | Admitting: Physical Therapy

## 2020-10-27 ENCOUNTER — Encounter: Payer: Self-pay | Admitting: Physical Therapy

## 2020-10-27 DIAGNOSIS — R2689 Other abnormalities of gait and mobility: Secondary | ICD-10-CM

## 2020-10-27 NOTE — Therapy (Signed)
[facility] [address] [place], [place], [address] Phone: [phone]   Fax:  [phone]  Physical Therapy Treatment/Discharge  Patient Details  Name: Antonio Douglas MRN: [id number] Date of Birth: 12/24/1940 Referring Provider (PT): Alonza Bogus   Encounter Date: 10/27/2020   PT End of Session - 10/27/20 1024    Visit Number 8    Number of Visits 13    Date for PT Re-Evaluation 12/23/20   60 day poc, 90 day cert   Authorization Type BCBS medicare (10th visit progress note)    PT Start Time 1014    PT Stop Time 1054    PT Time Calculation (min) 40 min    Equipment Utilized During Treatment Gait belt    Activity Tolerance Patient tolerated treatment well    Behavior During Therapy [facility] for tasks assessed/performed           Past Medical History:  Diagnosis Date  . BPH (benign prostatic hyperplasia)    mild  . Diabetes mellitus without complication ([place])   . Heart murmur   . Hypertension   . Pneumothorax    spontaneous    Past Surgical History:  Procedure Laterality Date  . CATARACT EXTRACTION, BILATERAL    . colonscopy  2016   dr Earlean Shawl  . INGUINAL HERNIA REPAIR Bilateral 01/17/2016   Procedure: LAPAROSCOPIC BILATERAL INGUINAL HERNIA REPAIR;  Surgeon: Alphonsa Overall, MD;  Location: [facility];  Service: General;  Laterality: Bilateral;  With MESH    There were no vitals filed for this visit.   Subjective Assessment - 10/27/20 1013    Subjective Pt thinks he is doing very well.    Currently in Pain? No/denies    Pain Score 0-No pain              [facility] PT Assessment - 10/27/20 0001      Transfers   Five time sit to stand comments  14.7 sec, no hands.    Stand to Sit 7: Independent      Functional Gait  Assessment   Gait Level Surface Walks 20 ft in less than 5.5 sec, no assistive devices, good speed, no evidence for imbalance, normal gait pattern, deviates no more than 6 in outside of the 12 in walkway width.     Change in Gait Speed Able to smoothly change walking speed without loss of balance or gait deviation. Deviate no more than 6 in outside of the 12 in walkway width.    Gait with Horizontal Head Turns Performs head turns smoothly with no change in gait. Deviates no more than 6 in outside 12 in walkway width    Gait with Vertical Head Turns Performs head turns with no change in gait. Deviates no more than 6 in outside 12 in walkway width.    Gait and Pivot Turn Pivot turns safely within 3 sec and stops quickly with no loss of balance.    Step Over Obstacle Is able to step over one shoe box (4.5 in total height) without changing gait speed. No evidence of imbalance.    Gait with Narrow Base of Support Ambulates 7-9 steps.    Gait with Eyes Closed Walks 20 ft, slow speed, abnormal gait pattern, evidence for imbalance, deviates 10-15 in outside 12 in walkway width. Requires more than 9 sec to ambulate 20 ft.    Ambulating Backwards Walks 20 ft, uses assistive device, slower speed, mild gait deviations, deviates 6-10 in outside 12 in walkway width.  Steps Alternating feet, no rail.    Total Score 25                         [facility] Adult PT Treatment/Exercise - 10/27/20 1033      Exercises   Exercises Other Exercises;Knee/Hip      Knee/Hip Exercises: Stretches   Active Hamstring Stretch --    Active Hamstring Stretch Limitations --      Knee/Hip Exercises: Aerobic   Recumbent Bike L2 x 10 min;      Knee/Hip Exercises: Standing   Hip Flexion 20 reps    Forward Step Up Step Height: 6";10 reps;Hand Hold: 0    Forward Step Up Limitations x5 1 HR , 8 in bil;    Stairs 5 steps , 6 in ; x 4 with no UE support    Other Standing Knee Exercises Tandem stance 30 sec x 2 bil; with  L/R head turns and UE fleixon x 10ea  bil;  Toe taps on 6 in step;    Other Standing Knee Exercises Tandem walk 15 ft x 4;      Knee/Hip Exercises: Seated   Long Arc Quad 20 reps;Both    Long Arc Quad Weight 4  lbs.    Sit to Sand 10 reps;1 set;without UE support                  PT Education - 10/27/20 1053    Education Details Final HEP reviewed in detail    Person(s) Educated Patient    Methods Explanation;Demonstration;Verbal cues;Handout    Comprehension Verbalized understanding;Returned demonstration;Verbal cues required            PT Short Term Goals - 10/27/20 1024      PT SHORT TERM GOAL #1   Title Pt will be independent with initial HEP for strengthening, balance and flexibility to continue gains.    Time 4    Period Weeks    Status Achieved    Target Date 10/24/20      PT SHORT TERM GOAL #2   Title Pt will decrease 5 x sit to stand from 13.64 sec to <11 sec for improved functional strength and balance.    Baseline 14. 7 sec at d/c with no UE support.    Time 4    Period Weeks    Status Partially Met    Target Date 10/24/20      PT SHORT TERM GOAL #3   Title FGA will be performed and LTG written to further assess balance and gait.    Baseline FGA was 20/30 on 09/27/20    Time 4    Period Weeks    Status Achieved    Target Date 10/24/20             PT Long Term Goals - 10/27/20 1025      PT LONG TERM GOAL #1   Title Pt will be independent with progressive HEP for strengthening, balance and flexibility to continue on own and transfer to gym program.    Time 8    Period Weeks    Status Achieved      PT LONG TERM GOAL #2   Title Pt will be able to perform 5 sit to stands without UE support from standard chair for improved functional strength.    Baseline unable to rise without hands on eval.    Time 8    Period Weeks    Status Achieved  PT LONG TERM GOAL #3   Title Pt will increase gait speed from 1.33ms to >1.214m for safe community ambulator speed.    Baseline 1.0967mon 09/23/20    Time 8    Period Weeks    Status Achieved      PT LONG TERM GOAL #4   Title Pt will be able to step up to 2nd step with minimal UE support for improved  functional strength to aide with stepping off boat.    Time 8    Period Weeks    Status Achieved      PT LONG TERM GOAL #5   Title Pt will ambulate >1000' on varied surfaces including up/down curbs independently for improved community access.    Time 8    Period Weeks    Status Achieved      PT LONG TERM GOAL #6   Title Pt will increase FGA from 20 to 24/30 or more for improved balance and gait safety.    Baseline 09/27/20 20/30    Time 8    Period Weeks    Status Achieved                 Plan - 10/27/20 1055    Clinical Impression Statement Pt doing very well, has met all goals at this time. Continues to have very mild instability with higher level balance, will continue to work on at home with HEP. Pt in agreement with D/C plan.    Examination-Activity Limitations Locomotion Level;Stairs;Transfers    Examination-Participation Restrictions Community Activity;Yard Work    Stability/Clinical Decision Making Stable/Uncomplicated    Rehab Potential Good    PT Frequency 2x / week   followed by 1x/week for 4 weeks. Plus eval   PT Duration 4 weeks    PT Treatment/Interventions ADLs/Self Care Home Management;DME Instruction;Gait training;Stair training;Functional mobility training;Therapeutic activities;Neuromuscular re-education;Balance training;Therapeutic exercise;Patient/family education;Manual techniques;Vestibular;Passive range of motion    PT Next Visit Plan .    PT Home Exercise Plan Access Code: [email or web address] Consulted and Agree with Plan of Care Patient;Family member/caregiver    Family Member Consulted wife           Patient will benefit from skilled therapeutic intervention in order to improve the following deficits and impairments:  Abnormal gait,Decreased range of motion,Decreased strength,Decreased balance,Impaired flexibility,Difficulty walking  Visit Diagnosis: Other abnormalities of gait and mobility     Problem List Patient Active Problem List    Diagnosis Date Noted  . Bilateral recurrent inguinal hernias 01/17/2016    LauLyndee HensenT, DPT 11:07 AM  10/27/20    [facility]one: [phone]:  [phone]: JamBUCKY GRIGGN: [id number] of Birth: 6/1July 06, 1942PHYSICAL THERAPY DISCHARGE SUMMARY  Visits from Start of Care: 8 Plan: Patient agrees to discharge.  Patient goals were met. Patient is being discharged due to meeting the stated rehab goals.  ?????    LauLyndee HensenT, DPT 11:08 AM  10/27/20

## 2020-10-27 NOTE — Patient Instructions (Signed)
Access Code: [email or web address] URL: [email or web address] Date: 10/27/2020 Prepared by: Sedalia Muta  Exercises Modified Maisie Fus Stretch - 1 x daily - 5 x weekly - 1 sets - 3 reps - 30 hold Supine Single Knee to Chest Stretch - 2 x daily - 3 reps - 30 hold Supine Bridge - 1 x daily - 5 x weekly - 2 sets - 10 reps Seated Knee Extension AROM - 1 x daily - 2 sets - 10 reps Sit to Stand with Arms Crossed - 1 x daily - 5 x weekly - 1 sets - 10 reps Standing Marching - 1 x daily - 2 sets - 10 reps Standing Toe Taps - 1 x daily - 2 sets - 10 reps Step Up - 1 x daily - 1 sets - 10 reps Tandem Walking with Counter Support - 1 x daily - 5 x weekly - 1 sets - 3 reps Standing Tandem Balance with Counter Support - 1 x daily - 3 reps - 30 hold

## 2020-10-28 ENCOUNTER — Ambulatory Visit: Payer: Medicare Other

## 2020-10-31 ENCOUNTER — Encounter: Payer: Medicare Other | Admitting: Physical Therapy

## 2020-11-03 ENCOUNTER — Encounter: Payer: Medicare Other | Admitting: Physical Therapy

## 2020-11-04 ENCOUNTER — Ambulatory Visit: Payer: Medicare Other

## 2020-11-07 ENCOUNTER — Encounter: Payer: Medicare Other | Admitting: Physical Therapy

## 2020-11-10 ENCOUNTER — Encounter: Payer: Medicare Other | Admitting: Physical Therapy

## 2020-11-11 ENCOUNTER — Ambulatory Visit: Payer: Medicare Other

## 2020-11-18 ENCOUNTER — Ambulatory Visit: Payer: Medicare Other

## 2020-11-24 DIAGNOSIS — R972 Elevated prostate specific antigen [PSA]: Secondary | ICD-10-CM | POA: Diagnosis not present

## 2020-11-24 DIAGNOSIS — N138 Other obstructive and reflux uropathy: Secondary | ICD-10-CM | POA: Diagnosis not present

## 2020-11-24 DIAGNOSIS — N401 Enlarged prostate with lower urinary tract symptoms: Secondary | ICD-10-CM | POA: Diagnosis not present

## 2020-12-05 DIAGNOSIS — I10 Essential (primary) hypertension: Secondary | ICD-10-CM | POA: Diagnosis not present

## 2020-12-05 DIAGNOSIS — E7800 Pure hypercholesterolemia, unspecified: Secondary | ICD-10-CM | POA: Diagnosis not present

## 2020-12-05 DIAGNOSIS — E119 Type 2 diabetes mellitus without complications: Secondary | ICD-10-CM | POA: Diagnosis not present

## 2020-12-05 DIAGNOSIS — R7989 Other specified abnormal findings of blood chemistry: Secondary | ICD-10-CM | POA: Diagnosis not present

## 2020-12-07 DIAGNOSIS — M542 Cervicalgia: Secondary | ICD-10-CM | POA: Diagnosis not present

## 2020-12-07 DIAGNOSIS — M5136 Other intervertebral disc degeneration, lumbar region: Secondary | ICD-10-CM | POA: Diagnosis not present

## 2020-12-07 DIAGNOSIS — M9901 Segmental and somatic dysfunction of cervical region: Secondary | ICD-10-CM | POA: Diagnosis not present

## 2020-12-07 DIAGNOSIS — M9903 Segmental and somatic dysfunction of lumbar region: Secondary | ICD-10-CM | POA: Diagnosis not present

## 2020-12-12 DIAGNOSIS — E538 Deficiency of other specified B group vitamins: Secondary | ICD-10-CM | POA: Diagnosis not present

## 2020-12-12 DIAGNOSIS — I10 Essential (primary) hypertension: Secondary | ICD-10-CM | POA: Diagnosis not present

## 2020-12-12 DIAGNOSIS — E7800 Pure hypercholesterolemia, unspecified: Secondary | ICD-10-CM | POA: Diagnosis not present

## 2020-12-12 DIAGNOSIS — Z0000 Encounter for general adult medical examination without abnormal findings: Secondary | ICD-10-CM | POA: Diagnosis not present

## 2020-12-23 ENCOUNTER — Other Ambulatory Visit: Payer: Self-pay | Admitting: Neurology

## 2020-12-26 NOTE — Telephone Encounter (Signed)
Rx(s) sent to pharmacy electronically.  

## 2021-02-16 ENCOUNTER — Other Ambulatory Visit: Payer: Self-pay | Admitting: Neurology

## 2021-03-01 ENCOUNTER — Ambulatory Visit: Payer: Medicare Other | Admitting: Podiatry

## 2021-03-30 NOTE — Progress Notes (Signed)
Assessment/Plan:    1.  Essential Tremor  -Continue primidone, 50 mg, 2 tablets twice per day.    -He will continue propranolol, 20 mg twice per day.  -He does have a component of rest tremor, but no other features of Parkinson's disease.   2.  Memory change  -Declines neurocognitive testing.  Will let me know if he changes his mind.  3.  Diabetic PN  -- Safety discussed.  Subjective:   Antonio Douglas was seen today in follow up for essential tremor.  My previous records were reviewed prior to todays visit.  Wife supplements hx. reports his  tremor is about the same.  States that it is not deteriorating We sent patient for physical therapy since last visit because of gait instability, likely related to diabetic neuropathy.  He did that in January and reports that "it went okay."  His wife states that he didn't follow through after, which was the issue.  Urology notes from February are reviewed.  Had a bad fall on Friday.   He was trying to pick up his cat and he fell on the ground - he broke the door on the dishwasher when he fell.  He did hit his head.  No LOC.  Wife states that memory isn't good (chronic) except for "what he wants to remember."  Does not want to do neurocog testing.  No lightheadedness/near syncope.  Current prescribed movement disorder medications: Primidone, 50 mg, 2 tablets twice per day (taking all 4 q hs) Propranolol, 20 mg twice daily    ALLERGIES:  No Known Allergies  CURRENT MEDICATIONS:  Outpatient Encounter Medications as of 04/03/2021  Medication Sig   atorvastatin (LIPITOR) 40 MG tablet Take 40 mg by mouth daily.   Cetirizine HCl (ZYRTEC ALLERGY PO) Take by mouth.   Cyanocobalamin (VITAMIN B12 PO) Take 1 tablet by mouth daily.   glimepiride (AMARYL) 2 MG tablet TK 1 T PO QD WITH BRE OR THE FIRST MAIN MEAL OF THE DAY   lisinopril (PRINIVIL,ZESTRIL) 5 MG tablet Take 5 mg by mouth every morning.   metFORMIN (GLUCOPHAGE) 1000 MG tablet Take 1,000 mg by  mouth 2 (two) times daily.   oxybutynin (DITROPAN) 5 MG tablet Take 5 mg by mouth as needed.   pioglitazone (ACTOS) 30 MG tablet Take 30 mg by mouth every evening.   primidone (MYSOLINE) 50 MG tablet TAKE 2 TABLETS BY MOUTH 2 TIMES DAILY   propranolol (INDERAL) 20 MG tablet TAKE 1 TABLET BY MOUTH 2 TIMES DAILY   sodium chloride (OCEAN) 0.65 % SOLN nasal spray Place 1 spray into both nostrils daily as needed for congestion.   tamsulosin (FLOMAX) 0.4 MG CAPS capsule Take 0.4 mg by mouth every morning.   [DISCONTINUED] loratadine (CLARITIN) 10 MG tablet Take 10 mg by mouth daily as needed for allergies.   No facility-administered encounter medications on file as of 04/03/2021.     Objective:    PHYSICAL EXAMINATION:    VITALS:   Vitals:   04/03/21 1107  BP: 108/60  Pulse: 65  SpO2: 97%  Weight: 230 lb (104.3 kg)  Height: 6' (1.829 m)     GEN:  The patient appears stated age and is in NAD. HEENT:  Normocephalic, atraumatic.  The mucous membranes are moist. The superficial temporal arteries are without ropiness or tenderness.   Neurological examination:  Orientation: The patient is alert and oriented x3. Cranial nerves: There is good facial symmetry. The speech is fluent and clear.  Soft palate rises symmetrically and there is no tongue deviation. Hearing is intact to conversational tone. Sensation: Sensation is intact to light touch throughout Motor: Strength is at least antigravity x4.  Movement examination: Tone: There is normal tone in the UE/LE Abnormal movements: there is postural tremor, R>L.  This is actually fairly mild.  He draws Archimedes spirals fairly well.  He does have some rest tremor when he is writing with the opposite hand.  Otherwise, no rest tremor is seen. Gait and Station: The patient pushes off of the chair.  He walks well today.   Total time spent on today's visit was 24 minutes, including both face-to-face time and nonface-to-face time.  Time included  that spent on review of records (prior notes available to me/labs/imaging if pertinent), discussing treatment and goals, answering patient's questions and coordinating care.  Cc:  Irena Reichmann, DO

## 2021-04-03 ENCOUNTER — Other Ambulatory Visit: Payer: Self-pay

## 2021-04-03 ENCOUNTER — Encounter: Payer: Self-pay | Admitting: Neurology

## 2021-04-03 ENCOUNTER — Ambulatory Visit: Payer: Medicare Other | Admitting: Neurology

## 2021-04-03 VITALS — BP 108/60 | HR 65 | Ht 72.0 in | Wt 230.0 lb

## 2021-04-03 DIAGNOSIS — G250 Essential tremor: Secondary | ICD-10-CM | POA: Diagnosis not present

## 2021-04-03 DIAGNOSIS — M542 Cervicalgia: Secondary | ICD-10-CM | POA: Diagnosis not present

## 2021-04-03 DIAGNOSIS — M5136 Other intervertebral disc degeneration, lumbar region: Secondary | ICD-10-CM | POA: Diagnosis not present

## 2021-04-03 DIAGNOSIS — E1142 Type 2 diabetes mellitus with diabetic polyneuropathy: Secondary | ICD-10-CM

## 2021-04-03 DIAGNOSIS — M9901 Segmental and somatic dysfunction of cervical region: Secondary | ICD-10-CM | POA: Diagnosis not present

## 2021-04-03 DIAGNOSIS — M9903 Segmental and somatic dysfunction of lumbar region: Secondary | ICD-10-CM | POA: Diagnosis not present

## 2021-04-03 MED ORDER — PRIMIDONE 50 MG PO TABS: 100.0000 mg | ORAL_TABLET | Freq: Two times a day (BID) | ORAL | 1 refills | Status: DC

## 2021-04-03 MED ORDER — PROPRANOLOL HCL 20 MG PO TABS: 20.0000 mg | ORAL_TABLET | Freq: Two times a day (BID) | ORAL | 1 refills | Status: DC

## 2021-04-04 DIAGNOSIS — M9903 Segmental and somatic dysfunction of lumbar region: Secondary | ICD-10-CM | POA: Diagnosis not present

## 2021-04-04 DIAGNOSIS — M5136 Other intervertebral disc degeneration, lumbar region: Secondary | ICD-10-CM | POA: Diagnosis not present

## 2021-04-04 DIAGNOSIS — M542 Cervicalgia: Secondary | ICD-10-CM | POA: Diagnosis not present

## 2021-04-04 DIAGNOSIS — M9901 Segmental and somatic dysfunction of cervical region: Secondary | ICD-10-CM | POA: Diagnosis not present

## 2021-05-08 DIAGNOSIS — H52202 Unspecified astigmatism, left eye: Secondary | ICD-10-CM | POA: Diagnosis not present

## 2021-06-02 DIAGNOSIS — D225 Melanocytic nevi of trunk: Secondary | ICD-10-CM | POA: Diagnosis not present

## 2021-06-02 DIAGNOSIS — L309 Dermatitis, unspecified: Secondary | ICD-10-CM | POA: Diagnosis not present

## 2021-06-02 DIAGNOSIS — Z1283 Encounter for screening for malignant neoplasm of skin: Secondary | ICD-10-CM | POA: Diagnosis not present

## 2021-06-02 DIAGNOSIS — D485 Neoplasm of uncertain behavior of skin: Secondary | ICD-10-CM | POA: Diagnosis not present

## 2021-06-02 DIAGNOSIS — L308 Other specified dermatitis: Secondary | ICD-10-CM | POA: Diagnosis not present

## 2021-06-22 DIAGNOSIS — I10 Essential (primary) hypertension: Secondary | ICD-10-CM | POA: Diagnosis not present

## 2021-06-22 DIAGNOSIS — Z125 Encounter for screening for malignant neoplasm of prostate: Secondary | ICD-10-CM | POA: Diagnosis not present

## 2021-06-22 DIAGNOSIS — E559 Vitamin D deficiency, unspecified: Secondary | ICD-10-CM | POA: Diagnosis not present

## 2021-06-22 DIAGNOSIS — E538 Deficiency of other specified B group vitamins: Secondary | ICD-10-CM | POA: Diagnosis not present

## 2021-06-22 DIAGNOSIS — E7800 Pure hypercholesterolemia, unspecified: Secondary | ICD-10-CM | POA: Diagnosis not present

## 2021-06-23 DIAGNOSIS — L308 Other specified dermatitis: Secondary | ICD-10-CM | POA: Diagnosis not present

## 2021-06-28 DIAGNOSIS — I10 Essential (primary) hypertension: Secondary | ICD-10-CM | POA: Diagnosis not present

## 2021-06-28 DIAGNOSIS — M9901 Segmental and somatic dysfunction of cervical region: Secondary | ICD-10-CM | POA: Diagnosis not present

## 2021-06-28 DIAGNOSIS — E1140 Type 2 diabetes mellitus with diabetic neuropathy, unspecified: Secondary | ICD-10-CM | POA: Diagnosis not present

## 2021-06-28 DIAGNOSIS — M5136 Other intervertebral disc degeneration, lumbar region: Secondary | ICD-10-CM | POA: Diagnosis not present

## 2021-06-28 DIAGNOSIS — M9903 Segmental and somatic dysfunction of lumbar region: Secondary | ICD-10-CM | POA: Diagnosis not present

## 2021-06-28 DIAGNOSIS — G250 Essential tremor: Secondary | ICD-10-CM | POA: Diagnosis not present

## 2021-06-28 DIAGNOSIS — E7800 Pure hypercholesterolemia, unspecified: Secondary | ICD-10-CM | POA: Diagnosis not present

## 2021-06-28 DIAGNOSIS — M542 Cervicalgia: Secondary | ICD-10-CM | POA: Diagnosis not present

## 2021-07-08 ENCOUNTER — Other Ambulatory Visit: Payer: Self-pay | Admitting: Neurology

## 2021-07-08 DIAGNOSIS — G250 Essential tremor: Secondary | ICD-10-CM

## 2021-07-10 ENCOUNTER — Other Ambulatory Visit: Payer: Self-pay

## 2021-08-31 DIAGNOSIS — E1140 Type 2 diabetes mellitus with diabetic neuropathy, unspecified: Secondary | ICD-10-CM | POA: Diagnosis not present

## 2021-08-31 DIAGNOSIS — I10 Essential (primary) hypertension: Secondary | ICD-10-CM | POA: Diagnosis not present

## 2021-08-31 DIAGNOSIS — H9203 Otalgia, bilateral: Secondary | ICD-10-CM | POA: Diagnosis not present

## 2021-09-04 DIAGNOSIS — M9903 Segmental and somatic dysfunction of lumbar region: Secondary | ICD-10-CM | POA: Diagnosis not present

## 2021-09-04 DIAGNOSIS — M5136 Other intervertebral disc degeneration, lumbar region: Secondary | ICD-10-CM | POA: Diagnosis not present

## 2021-09-04 DIAGNOSIS — M9901 Segmental and somatic dysfunction of cervical region: Secondary | ICD-10-CM | POA: Diagnosis not present

## 2021-09-04 DIAGNOSIS — M542 Cervicalgia: Secondary | ICD-10-CM | POA: Diagnosis not present

## 2021-09-13 DIAGNOSIS — M5136 Other intervertebral disc degeneration, lumbar region: Secondary | ICD-10-CM | POA: Diagnosis not present

## 2021-09-13 DIAGNOSIS — M9901 Segmental and somatic dysfunction of cervical region: Secondary | ICD-10-CM | POA: Diagnosis not present

## 2021-09-13 DIAGNOSIS — M542 Cervicalgia: Secondary | ICD-10-CM | POA: Diagnosis not present

## 2021-09-13 DIAGNOSIS — M9903 Segmental and somatic dysfunction of lumbar region: Secondary | ICD-10-CM | POA: Diagnosis not present

## 2021-10-03 NOTE — Progress Notes (Signed)
Assessment/Plan:    1.  Essential Tremor  -Continue primidone, 50 mg, 2 tablets twice per day.    -He will continue propranolol, 20 mg twice per day.  -He has some rest tremor and has had more falls.  Wife describes shuffling (did not see it today).  Because of these things, we will go ahead and proceed with DaTscan.   2.  Memory change  -Declines neurocognitive testing.  Will let me know if he changes his mind.  3.  Diabetic PN  -- We have discussed safety associated with this.  He has had a feeling of loss of balance, although no real falls.  Discussed that medications really do not help the balance, although balance physical therapy can.  Subjective:   Antonio Douglas was seen today in follow up for essential tremor.  My previous records were reviewed prior to todays visit.  Wife supplements hx. reports his  tremor is about the same.  He emailed me in September stating that he had a minor fall while weeding his yard and asked if it could be related to his multiple medications.  He did tell me he had an appointment the following day to discuss it with his primary care.  I told him to see what his primary care physician thought, since most of the medications were prescribed by primary care, but was happy to send an order for physical therapy as well.  He told me he would get back to me about that.  I did remind him that his diabetic neuropathy could certainly cause some balance change.  He emailed me again at the beginning of November stating that he was noticing some increased balance problems when leaning down to tie his shoes, if 1 foot is elevated (standing on 1 foot).  Wife also noting shuffling.    He has had no lightheadedness or dizziness.  Again, physical therapy was offered, but the patient just wanted to talk about it at this visit and make me aware through email.  He did start a virtual silver sneakers via roku and he likes that.  Current prescribed movement disorder  medications: Primidone, 50 mg, 2 tablets twice per day (taking all 4 q hs) Propranolol, 20 mg twice daily    ALLERGIES:  No Known Allergies  CURRENT MEDICATIONS:  Outpatient Encounter Medications as of 10/17/2021  Medication Sig   atorvastatin (LIPITOR) 40 MG tablet Take 40 mg by mouth daily.   Cetirizine HCl (ZYRTEC ALLERGY PO) Take by mouth.   Cyanocobalamin (VITAMIN B12 PO) Take 1 tablet by mouth daily.   glimepiride (AMARYL) 2 MG tablet TK 1 T PO QD WITH BRE OR THE FIRST MAIN MEAL OF THE DAY   lisinopril (PRINIVIL,ZESTRIL) 5 MG tablet Take 5 mg by mouth every morning.   metFORMIN (GLUCOPHAGE) 1000 MG tablet Take 1,000 mg by mouth 2 (two) times daily.   oxybutynin (DITROPAN) 5 MG tablet Take 5 mg by mouth as needed.   pioglitazone (ACTOS) 30 MG tablet Take 30 mg by mouth every evening.   primidone (MYSOLINE) 50 MG tablet TAKE 2 TABLETS BY MOUTH 2 TIMES DAILY   propranolol (INDERAL) 20 MG tablet Take 1 tablet (20 mg total) by mouth 2 (two) times daily.   sodium chloride (OCEAN) 0.65 % SOLN nasal spray Place 1 spray into both nostrils daily as needed for congestion.   tamsulosin (FLOMAX) 0.4 MG CAPS capsule Take 0.4 mg by mouth every morning.   No facility-administered encounter medications on  file as of 10/17/2021.     Objective:    PHYSICAL EXAMINATION:    VITALS:   Vitals:   10/17/21 1120  BP: 123/62  Pulse: 62  SpO2: 94%  Weight: 232 lb 9.6 oz (105.5 kg)  Height: 6' (1.829 m)      GEN:  The patient appears stated age and is in NAD. HEENT:  Normocephalic, atraumatic.  The mucous membranes are moist. The superficial temporal arteries are without ropiness or tenderness. Cardiovascular: Regular rate rhythm Lungs: Clear to auscultation bilaterally Neck: No bruits  Neurological examination:  Orientation: The patient is alert and oriented x3. Cranial nerves: There is good facial symmetry. There is facial hypomimia with lips parted.  The speech is fluent and clear. Soft  palate rises symmetrically and there is no tongue deviation. Hearing is intact to conversational tone. Sensation: Sensation is intact to light touch throughout Motor: Strength is at least antigravity x4.  Movement examination: Tone: There is ?min increased tone in the RUE Abnormal movements: there is postural tremor, R>L.  He does have very mild rest tremor, right greater than left. Gait and Station: The patient pushes off of the chair.  He walks well today.  Did not see any shuffling.   Total time spent on today's visit was 25 minutes, including both face-to-face time and nonface-to-face time.  Time included that spent on review of records (prior notes available to me/labs/imaging if pertinent), discussing treatment and goals, answering patient's questions and coordinating care.  Cc:  Merri Brunette, MD

## 2021-10-04 DIAGNOSIS — M9901 Segmental and somatic dysfunction of cervical region: Secondary | ICD-10-CM | POA: Diagnosis not present

## 2021-10-04 DIAGNOSIS — M542 Cervicalgia: Secondary | ICD-10-CM | POA: Diagnosis not present

## 2021-10-04 DIAGNOSIS — M9903 Segmental and somatic dysfunction of lumbar region: Secondary | ICD-10-CM | POA: Diagnosis not present

## 2021-10-04 DIAGNOSIS — M5136 Other intervertebral disc degeneration, lumbar region: Secondary | ICD-10-CM | POA: Diagnosis not present

## 2021-10-17 ENCOUNTER — Ambulatory Visit: Payer: Medicare Other | Admitting: Neurology

## 2021-10-17 ENCOUNTER — Other Ambulatory Visit: Payer: Self-pay

## 2021-10-17 ENCOUNTER — Encounter: Payer: Self-pay | Admitting: Neurology

## 2021-10-17 VITALS — BP 123/62 | HR 62 | Ht 72.0 in | Wt 232.6 lb

## 2021-10-17 DIAGNOSIS — G250 Essential tremor: Secondary | ICD-10-CM

## 2021-10-18 ENCOUNTER — Other Ambulatory Visit: Payer: Self-pay

## 2021-10-18 DIAGNOSIS — G250 Essential tremor: Secondary | ICD-10-CM

## 2021-10-31 DIAGNOSIS — E7800 Pure hypercholesterolemia, unspecified: Secondary | ICD-10-CM | POA: Diagnosis not present

## 2021-10-31 DIAGNOSIS — I10 Essential (primary) hypertension: Secondary | ICD-10-CM | POA: Diagnosis not present

## 2021-10-31 DIAGNOSIS — E1140 Type 2 diabetes mellitus with diabetic neuropathy, unspecified: Secondary | ICD-10-CM | POA: Diagnosis not present

## 2021-11-02 ENCOUNTER — Ambulatory Visit (HOSPITAL_COMMUNITY)
Admission: RE | Admit: 2021-11-02 | Discharge: 2021-11-02 | Disposition: A | Discharge status: Home / Self Care | Payer: Medicare Other | Source: Ambulatory Visit | Attending: Neurology | Admitting: Neurology

## 2021-11-02 ENCOUNTER — Encounter (HOSPITAL_COMMUNITY)
Admission: RE | Admit: 2021-11-02 | Discharge: 2021-11-02 | Disposition: A | Discharge status: Home / Self Care | Payer: Medicare Other | Source: Ambulatory Visit | Attending: Neurology | Admitting: Neurology

## 2021-11-02 ENCOUNTER — Other Ambulatory Visit: Payer: Self-pay

## 2021-11-02 DIAGNOSIS — G250 Essential tremor: Secondary | ICD-10-CM | POA: Insufficient documentation

## 2021-11-02 MED ORDER — POTASSIUM IODIDE (ANTIDOTE) 130 MG PO TABS
ORAL_TABLET | ORAL | Status: AC
  Filled 2021-11-02: qty 1

## 2021-11-02 MED ORDER — IOFLUPANE I 123 185 MBQ/2.5ML IV SOLN
4.8000 | Freq: Once | INTRAVENOUS | Status: AC | PRN
  Administered 2021-11-02: 4.8 via INTRAVENOUS
  Filled 2021-11-02: qty 5

## 2021-11-02 MED ORDER — POTASSIUM IODIDE (ANTIDOTE) 130 MG PO TABS: 130.0000 mg | ORAL_TABLET | Freq: Once | ORAL | Status: DC

## 2021-11-03 DIAGNOSIS — G20 Parkinson's disease: Secondary | ICD-10-CM | POA: Diagnosis not present

## 2021-11-03 DIAGNOSIS — R296 Repeated falls: Secondary | ICD-10-CM | POA: Diagnosis not present

## 2021-11-22 ENCOUNTER — Encounter: Payer: Self-pay | Admitting: Podiatry

## 2021-11-22 ENCOUNTER — Other Ambulatory Visit: Payer: Self-pay

## 2021-11-22 ENCOUNTER — Ambulatory Visit: Payer: Medicare Other | Admitting: Podiatry

## 2021-11-22 DIAGNOSIS — M722 Plantar fascial fibromatosis: Secondary | ICD-10-CM

## 2021-11-22 DIAGNOSIS — B351 Tinea unguium: Secondary | ICD-10-CM

## 2021-11-22 DIAGNOSIS — E1151 Type 2 diabetes mellitus with diabetic peripheral angiopathy without gangrene: Secondary | ICD-10-CM

## 2021-11-22 NOTE — Progress Notes (Signed)
Subjective:   Patient ID: Antonio Douglas, male   DOB: 81 y.o.   MRN: [id number]   HPI Patient presents with relatively poor hygiene with significant nail disease incurvation moderate numbing like sensations in both feet.  Patient does not smoke currently tries to be active   ROS      Objective:  Physical Exam  Neurovascular status was found to be intact with the patient found to have mild incurvation of the nail bed bilateral with diminishment of sharp dull vibratory     Assessment:  Moderate problems associated with nail disease and long-term diabetes     Plan:  H&P reviewed diabetes and discussed generalized digital care nail care and daily inspections of feet.  At this point everything seems to be relatively stable and that was reviewed with the patient

## 2021-11-30 DIAGNOSIS — N138 Other obstructive and reflux uropathy: Secondary | ICD-10-CM | POA: Diagnosis not present

## 2021-11-30 DIAGNOSIS — N401 Enlarged prostate with lower urinary tract symptoms: Secondary | ICD-10-CM | POA: Diagnosis not present

## 2021-11-30 DIAGNOSIS — R972 Elevated prostate specific antigen [PSA]: Secondary | ICD-10-CM | POA: Diagnosis not present

## 2021-12-08 DIAGNOSIS — I10 Essential (primary) hypertension: Secondary | ICD-10-CM | POA: Diagnosis not present

## 2021-12-08 DIAGNOSIS — E7800 Pure hypercholesterolemia, unspecified: Secondary | ICD-10-CM | POA: Diagnosis not present

## 2021-12-08 DIAGNOSIS — E119 Type 2 diabetes mellitus without complications: Secondary | ICD-10-CM | POA: Diagnosis not present

## 2021-12-13 ENCOUNTER — Other Ambulatory Visit: Payer: Self-pay | Admitting: Internal Medicine

## 2021-12-13 DIAGNOSIS — R972 Elevated prostate specific antigen [PSA]: Secondary | ICD-10-CM | POA: Diagnosis not present

## 2021-12-13 DIAGNOSIS — E1140 Type 2 diabetes mellitus with diabetic neuropathy, unspecified: Secondary | ICD-10-CM | POA: Diagnosis not present

## 2021-12-13 DIAGNOSIS — I10 Essential (primary) hypertension: Secondary | ICD-10-CM | POA: Diagnosis not present

## 2021-12-13 DIAGNOSIS — Z0000 Encounter for general adult medical examination without abnormal findings: Secondary | ICD-10-CM | POA: Diagnosis not present

## 2021-12-14 ENCOUNTER — Other Ambulatory Visit: Payer: Self-pay | Admitting: Neurology

## 2021-12-20 DIAGNOSIS — M9901 Segmental and somatic dysfunction of cervical region: Secondary | ICD-10-CM | POA: Diagnosis not present

## 2021-12-20 DIAGNOSIS — M9903 Segmental and somatic dysfunction of lumbar region: Secondary | ICD-10-CM | POA: Diagnosis not present

## 2021-12-20 DIAGNOSIS — M5136 Other intervertebral disc degeneration, lumbar region: Secondary | ICD-10-CM | POA: Diagnosis not present

## 2021-12-20 DIAGNOSIS — M542 Cervicalgia: Secondary | ICD-10-CM | POA: Diagnosis not present

## 2022-01-12 ENCOUNTER — Ambulatory Visit
Admission: RE | Admit: 2022-01-12 | Discharge: 2022-01-12 | Disposition: A | Discharge status: Home / Self Care | Payer: Self-pay | Source: Ambulatory Visit | Attending: Internal Medicine | Admitting: Internal Medicine

## 2022-01-12 DIAGNOSIS — I10 Essential (primary) hypertension: Secondary | ICD-10-CM

## 2022-02-08 ENCOUNTER — Other Ambulatory Visit: Payer: Self-pay | Admitting: Neurology

## 2022-02-20 DIAGNOSIS — I2510 Atherosclerotic heart disease of native coronary artery without angina pectoris: Secondary | ICD-10-CM | POA: Diagnosis not present

## 2022-04-03 DIAGNOSIS — I2510 Atherosclerotic heart disease of native coronary artery without angina pectoris: Secondary | ICD-10-CM | POA: Diagnosis not present

## 2022-04-10 DIAGNOSIS — I440 Atrioventricular block, first degree: Secondary | ICD-10-CM | POA: Diagnosis not present

## 2022-04-16 NOTE — Progress Notes (Signed)
Assessment/Plan:    1.  Essential Tremor  -Continue primidone, 50 mg, 2 tablets twice per day.    -He will continue propranolol, 20 mg twice per day.  -DaTscan completed January, 2023.  DaTscan was negative.  Will continue to follow clinically   2.  Memory change  -Declines neurocognitive testing.  Will let me know if he changes his mind.  3.  Diabetic PN  -- We have discussed safety associated with this.  He is exercising more and has had no falls.  4.  Follow-up 9 months to 1 year.  Subjective:   Antonio Douglas was seen today in follow up for essential tremor.  My previous records were reviewed prior to todays visit.  Wife supplements hx. reports his  tremor is about the same.  DaTscan was done since our last visit due to increasing tremor and falls.  This was done November 02, 2021.  DaTscan was negative.  He is exercising more.  No further falls.    Current prescribed movement disorder medications: Primidone, 50 mg, 2 tablets twice per day (taking all 4 q hs) Propranolol, 20 mg twice daily    ALLERGIES:  No Known Allergies  CURRENT MEDICATIONS:  Outpatient Encounter Medications as of 04/30/2022  Medication Sig   atorvastatin (LIPITOR) 40 MG tablet Take 40 mg by mouth daily.   Cetirizine HCl (ZYRTEC ALLERGY PO) Take by mouth.   ezetimibe (ZETIA) 10 MG tablet Take 10 mg by mouth daily.   glimepiride (AMARYL) 2 MG tablet TK 1 T PO QD WITH BRE OR THE FIRST MAIN MEAL OF THE DAY   lisinopril (PRINIVIL,ZESTRIL) 5 MG tablet Take 5 mg by mouth every morning.   metFORMIN (GLUCOPHAGE) 1000 MG tablet Take 1,000 mg by mouth 2 (two) times daily.   pioglitazone (ACTOS) 30 MG tablet Take 30 mg by mouth every evening.   primidone (MYSOLINE) 50 MG tablet TAKE 2 TABLETS BY MOUTH 2 TIMES DAILY   propranolol (INDERAL) 20 MG tablet TAKE 1 TABLET BY MOUTH 2 TIMES DAILY   sodium chloride (OCEAN) 0.65 % SOLN nasal spray Place 1 spray into both nostrils daily as needed for congestion.    tamsulosin (FLOMAX) 0.4 MG CAPS capsule Take 0.4 mg by mouth every morning.   [DISCONTINUED] Cyanocobalamin (VITAMIN B12 PO) Take 1 tablet by mouth daily. (Patient not taking: Reported on 04/30/2022)   [DISCONTINUED] oxybutynin (DITROPAN) 5 MG tablet Take 5 mg by mouth as needed. (Patient not taking: Reported on 04/30/2022)   No facility-administered encounter medications on file as of 04/30/2022.     Objective:    PHYSICAL EXAMINATION:    VITALS:   Vitals:   04/30/22 1109  BP: 106/65  Pulse: 63  SpO2: 97%  Weight: 228 lb (103.4 kg)  Height: 6' (1.829 m)       GEN:  The patient appears stated age and is in NAD. HEENT:  Normocephalic, atraumatic.  The mucous membranes are moist. The superficial temporal arteries are without ropiness or tenderness.   Neurological examination:  Orientation: The patient is alert and oriented x3. Cranial nerves: There is good facial symmetry. There is facial hypomimia with lips parted.  The speech is fluent and clear. Soft palate rises symmetrically and there is no tongue deviation. Hearing is intact to conversational tone. Sensation: Sensation is intact to light touch throughout Motor: Strength is at least antigravity x4.  Movement examination: Tone: Normal tone today. Abnormal movements: there is postural tremor, R>L.  This is same as  last visit.  There is mild left upper extremity rest tremor.  Archimedes spirals are drawn well on the right with tremor on the left. Gait and Station: The patient pushes off of the chair.  He walks well today.  Did not see any shuffling.   Total time spent on today's visit was 20 minutes, including both face-to-face time and nonface-to-face time.  Time included that spent on review of records (prior notes available to me/labs/imaging if pertinent), discussing treatment and goals, answering patient's questions and coordinating care.  Cc:  Merri Brunette, MD

## 2022-04-30 ENCOUNTER — Ambulatory Visit (INDEPENDENT_AMBULATORY_CARE_PROVIDER_SITE_OTHER): Payer: Medicare Other | Admitting: Neurology

## 2022-04-30 ENCOUNTER — Other Ambulatory Visit: Payer: Self-pay | Admitting: Neurology

## 2022-04-30 ENCOUNTER — Encounter: Payer: Self-pay | Admitting: Neurology

## 2022-04-30 VITALS — BP 106/65 | HR 63 | Ht 72.0 in | Wt 228.0 lb

## 2022-04-30 DIAGNOSIS — G250 Essential tremor: Secondary | ICD-10-CM

## 2022-04-30 MED ORDER — PROPRANOLOL HCL 20 MG PO TABS: 20.0000 mg | ORAL_TABLET | Freq: Two times a day (BID) | ORAL | 3 refills | Status: DC

## 2022-04-30 NOTE — Patient Instructions (Signed)
The physicians and staff at [facility] are committed to providing excellent care. You may receive a survey requesting feedback about your experience at our office. We strive to receive "very good" responses to the survey questions. If you feel that your experience would prevent you from giving the office a "very good " response, please contact our office to try to remedy the situation. We may be reached at [phone]. Thank you for taking the time out of your busy day to complete the survey.  

## 2022-05-01 DIAGNOSIS — I517 Cardiomegaly: Secondary | ICD-10-CM | POA: Diagnosis not present

## 2022-05-02 DIAGNOSIS — E7800 Pure hypercholesterolemia, unspecified: Secondary | ICD-10-CM | POA: Diagnosis not present

## 2022-05-02 DIAGNOSIS — I10 Essential (primary) hypertension: Secondary | ICD-10-CM | POA: Diagnosis not present

## 2022-05-02 DIAGNOSIS — E1140 Type 2 diabetes mellitus with diabetic neuropathy, unspecified: Secondary | ICD-10-CM | POA: Diagnosis not present

## 2022-05-08 DIAGNOSIS — M5136 Other intervertebral disc degeneration, lumbar region: Secondary | ICD-10-CM | POA: Diagnosis not present

## 2022-05-08 DIAGNOSIS — M9903 Segmental and somatic dysfunction of lumbar region: Secondary | ICD-10-CM | POA: Diagnosis not present

## 2022-05-08 DIAGNOSIS — M542 Cervicalgia: Secondary | ICD-10-CM | POA: Diagnosis not present

## 2022-05-08 DIAGNOSIS — M9901 Segmental and somatic dysfunction of cervical region: Secondary | ICD-10-CM | POA: Diagnosis not present

## 2022-05-09 DIAGNOSIS — H52202 Unspecified astigmatism, left eye: Secondary | ICD-10-CM | POA: Diagnosis not present

## 2022-05-18 ENCOUNTER — Ambulatory Visit: Payer: Self-pay | Admitting: Podiatry

## 2022-05-22 DIAGNOSIS — C44622 Squamous cell carcinoma of skin of right upper limb, including shoulder: Secondary | ICD-10-CM | POA: Diagnosis not present

## 2022-05-22 DIAGNOSIS — L570 Actinic keratosis: Secondary | ICD-10-CM | POA: Diagnosis not present

## 2022-05-22 DIAGNOSIS — X32XXXD Exposure to sunlight, subsequent encounter: Secondary | ICD-10-CM | POA: Diagnosis not present

## 2022-05-24 ENCOUNTER — Ambulatory Visit: Payer: Self-pay | Admitting: Podiatry

## 2022-06-14 DIAGNOSIS — R972 Elevated prostate specific antigen [PSA]: Secondary | ICD-10-CM | POA: Diagnosis not present

## 2022-06-14 DIAGNOSIS — N138 Other obstructive and reflux uropathy: Secondary | ICD-10-CM | POA: Diagnosis not present

## 2022-06-14 DIAGNOSIS — N401 Enlarged prostate with lower urinary tract symptoms: Secondary | ICD-10-CM | POA: Diagnosis not present

## 2022-07-04 DIAGNOSIS — M9901 Segmental and somatic dysfunction of cervical region: Secondary | ICD-10-CM | POA: Diagnosis not present

## 2022-07-04 DIAGNOSIS — M9903 Segmental and somatic dysfunction of lumbar region: Secondary | ICD-10-CM | POA: Diagnosis not present

## 2022-07-04 DIAGNOSIS — M5136 Other intervertebral disc degeneration, lumbar region: Secondary | ICD-10-CM | POA: Diagnosis not present

## 2022-07-04 DIAGNOSIS — M542 Cervicalgia: Secondary | ICD-10-CM | POA: Diagnosis not present

## 2022-07-10 DIAGNOSIS — D225 Melanocytic nevi of trunk: Secondary | ICD-10-CM | POA: Diagnosis not present

## 2022-07-10 DIAGNOSIS — Z08 Encounter for follow-up examination after completed treatment for malignant neoplasm: Secondary | ICD-10-CM | POA: Diagnosis not present

## 2022-07-10 DIAGNOSIS — Z85828 Personal history of other malignant neoplasm of skin: Secondary | ICD-10-CM | POA: Diagnosis not present

## 2022-07-10 DIAGNOSIS — L821 Other seborrheic keratosis: Secondary | ICD-10-CM | POA: Diagnosis not present

## 2022-07-23 ENCOUNTER — Other Ambulatory Visit: Payer: Self-pay | Admitting: Neurology

## 2022-08-14 DIAGNOSIS — M9903 Segmental and somatic dysfunction of lumbar region: Secondary | ICD-10-CM | POA: Diagnosis not present

## 2022-08-14 DIAGNOSIS — M9901 Segmental and somatic dysfunction of cervical region: Secondary | ICD-10-CM | POA: Diagnosis not present

## 2022-08-14 DIAGNOSIS — M9902 Segmental and somatic dysfunction of thoracic region: Secondary | ICD-10-CM | POA: Diagnosis not present

## 2022-08-14 DIAGNOSIS — M542 Cervicalgia: Secondary | ICD-10-CM | POA: Diagnosis not present

## 2022-08-15 DIAGNOSIS — M542 Cervicalgia: Secondary | ICD-10-CM | POA: Diagnosis not present

## 2022-08-15 DIAGNOSIS — M9903 Segmental and somatic dysfunction of lumbar region: Secondary | ICD-10-CM | POA: Diagnosis not present

## 2022-08-15 DIAGNOSIS — M9902 Segmental and somatic dysfunction of thoracic region: Secondary | ICD-10-CM | POA: Diagnosis not present

## 2022-08-15 DIAGNOSIS — M9901 Segmental and somatic dysfunction of cervical region: Secondary | ICD-10-CM | POA: Diagnosis not present

## 2022-08-20 DIAGNOSIS — M9903 Segmental and somatic dysfunction of lumbar region: Secondary | ICD-10-CM | POA: Diagnosis not present

## 2022-08-20 DIAGNOSIS — M9902 Segmental and somatic dysfunction of thoracic region: Secondary | ICD-10-CM | POA: Diagnosis not present

## 2022-08-20 DIAGNOSIS — M542 Cervicalgia: Secondary | ICD-10-CM | POA: Diagnosis not present

## 2022-08-20 DIAGNOSIS — M9901 Segmental and somatic dysfunction of cervical region: Secondary | ICD-10-CM | POA: Diagnosis not present

## 2022-10-18 DIAGNOSIS — I10 Essential (primary) hypertension: Secondary | ICD-10-CM | POA: Diagnosis not present

## 2022-10-18 DIAGNOSIS — G629 Polyneuropathy, unspecified: Secondary | ICD-10-CM | POA: Diagnosis not present

## 2022-10-22 ENCOUNTER — Other Ambulatory Visit: Payer: Self-pay | Admitting: Neurology

## 2022-10-22 DIAGNOSIS — G250 Essential tremor: Secondary | ICD-10-CM

## 2022-11-06 DIAGNOSIS — M9901 Segmental and somatic dysfunction of cervical region: Secondary | ICD-10-CM | POA: Diagnosis not present

## 2022-11-06 DIAGNOSIS — M542 Cervicalgia: Secondary | ICD-10-CM | POA: Diagnosis not present

## 2022-11-06 DIAGNOSIS — M9903 Segmental and somatic dysfunction of lumbar region: Secondary | ICD-10-CM | POA: Diagnosis not present

## 2022-11-06 DIAGNOSIS — M9902 Segmental and somatic dysfunction of thoracic region: Secondary | ICD-10-CM | POA: Diagnosis not present

## 2022-11-20 DIAGNOSIS — M9901 Segmental and somatic dysfunction of cervical region: Secondary | ICD-10-CM | POA: Diagnosis not present

## 2022-11-20 DIAGNOSIS — M542 Cervicalgia: Secondary | ICD-10-CM | POA: Diagnosis not present

## 2022-11-20 DIAGNOSIS — M9902 Segmental and somatic dysfunction of thoracic region: Secondary | ICD-10-CM | POA: Diagnosis not present

## 2022-11-20 DIAGNOSIS — M9903 Segmental and somatic dysfunction of lumbar region: Secondary | ICD-10-CM | POA: Diagnosis not present

## 2022-12-12 DIAGNOSIS — E1140 Type 2 diabetes mellitus with diabetic neuropathy, unspecified: Secondary | ICD-10-CM | POA: Diagnosis not present

## 2022-12-12 DIAGNOSIS — I10 Essential (primary) hypertension: Secondary | ICD-10-CM | POA: Diagnosis not present

## 2022-12-17 DIAGNOSIS — I10 Essential (primary) hypertension: Secondary | ICD-10-CM | POA: Diagnosis not present

## 2022-12-17 DIAGNOSIS — I7300 Raynaud's syndrome without gangrene: Secondary | ICD-10-CM | POA: Diagnosis not present

## 2022-12-17 DIAGNOSIS — R6889 Other general symptoms and signs: Secondary | ICD-10-CM | POA: Diagnosis not present

## 2022-12-17 DIAGNOSIS — Z0000 Encounter for general adult medical examination without abnormal findings: Secondary | ICD-10-CM | POA: Diagnosis not present

## 2022-12-17 DIAGNOSIS — R972 Elevated prostate specific antigen [PSA]: Secondary | ICD-10-CM | POA: Diagnosis not present

## 2022-12-24 DIAGNOSIS — R262 Difficulty in walking, not elsewhere classified: Secondary | ICD-10-CM | POA: Diagnosis not present

## 2022-12-24 DIAGNOSIS — R293 Abnormal posture: Secondary | ICD-10-CM | POA: Diagnosis not present

## 2022-12-24 DIAGNOSIS — R29898 Other symptoms and signs involving the musculoskeletal system: Secondary | ICD-10-CM | POA: Diagnosis not present

## 2022-12-27 DIAGNOSIS — R293 Abnormal posture: Secondary | ICD-10-CM | POA: Diagnosis not present

## 2022-12-27 DIAGNOSIS — R262 Difficulty in walking, not elsewhere classified: Secondary | ICD-10-CM | POA: Diagnosis not present

## 2022-12-27 DIAGNOSIS — R29898 Other symptoms and signs involving the musculoskeletal system: Secondary | ICD-10-CM | POA: Diagnosis not present

## 2022-12-31 DIAGNOSIS — R293 Abnormal posture: Secondary | ICD-10-CM | POA: Diagnosis not present

## 2022-12-31 DIAGNOSIS — R29898 Other symptoms and signs involving the musculoskeletal system: Secondary | ICD-10-CM | POA: Diagnosis not present

## 2022-12-31 DIAGNOSIS — R262 Difficulty in walking, not elsewhere classified: Secondary | ICD-10-CM | POA: Diagnosis not present

## 2023-01-03 DIAGNOSIS — N401 Enlarged prostate with lower urinary tract symptoms: Secondary | ICD-10-CM | POA: Diagnosis not present

## 2023-01-03 DIAGNOSIS — R3915 Urgency of urination: Secondary | ICD-10-CM | POA: Diagnosis not present

## 2023-01-03 DIAGNOSIS — R293 Abnormal posture: Secondary | ICD-10-CM | POA: Diagnosis not present

## 2023-01-03 DIAGNOSIS — R29898 Other symptoms and signs involving the musculoskeletal system: Secondary | ICD-10-CM | POA: Diagnosis not present

## 2023-01-03 DIAGNOSIS — N138 Other obstructive and reflux uropathy: Secondary | ICD-10-CM | POA: Diagnosis not present

## 2023-01-03 DIAGNOSIS — R972 Elevated prostate specific antigen [PSA]: Secondary | ICD-10-CM | POA: Diagnosis not present

## 2023-01-03 DIAGNOSIS — R262 Difficulty in walking, not elsewhere classified: Secondary | ICD-10-CM | POA: Diagnosis not present

## 2023-01-07 DIAGNOSIS — R29898 Other symptoms and signs involving the musculoskeletal system: Secondary | ICD-10-CM | POA: Diagnosis not present

## 2023-01-07 DIAGNOSIS — R262 Difficulty in walking, not elsewhere classified: Secondary | ICD-10-CM | POA: Diagnosis not present

## 2023-01-07 DIAGNOSIS — R293 Abnormal posture: Secondary | ICD-10-CM | POA: Diagnosis not present

## 2023-01-08 IMAGING — NM NM DATSCAN
3 series · 28 of 35 positions shown · non-contrast
Comparison: None.

CLINICAL DATA: 80-year-old male with a central tremor. Recent
falls.

EXAM:
NUCLEAR MEDICINE BRAIN IMAGING WITH SPECT  (DaTscan )
TECHNIQUE: SPECT images of the brain were obtained after intravenous injection
of radiopharmaceutical. 4 hour post injection imaging. Appropriate
positioning.
130 mg MEGLIO STAT given orally for thyroid blockade.
RADIOPHARMACEUTICALS:  4.8 millicuries I 123 Ioflupane

[Series 1: dat scan · 4.14mm/px · 5 of 120 frames shown]
[frame 11/120  full-range]
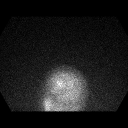
[frame 31/120  full-range]
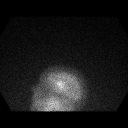
[frame 71/120  full-range]
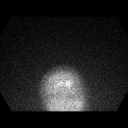
[frame 91/120  full-range]
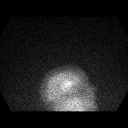
[frame 111/120  full-range]
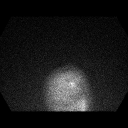

[Series 7: datquant results · 4.1mm · 4.14mm/px · 5 of 128 frames shown]
[frame 11/128  full-range]
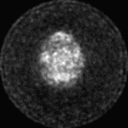
[frame 54/128  full-range]
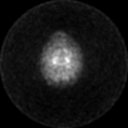
[frame 75/128  full-range]
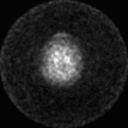
[frame 96/128  full-range]
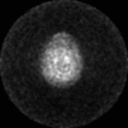
[frame 118/128  full-range]
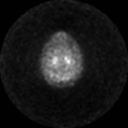

[Series 1014: mpr (id) range · 0.37mm/px · 18 of 43 slices shown]
[im 2/43]
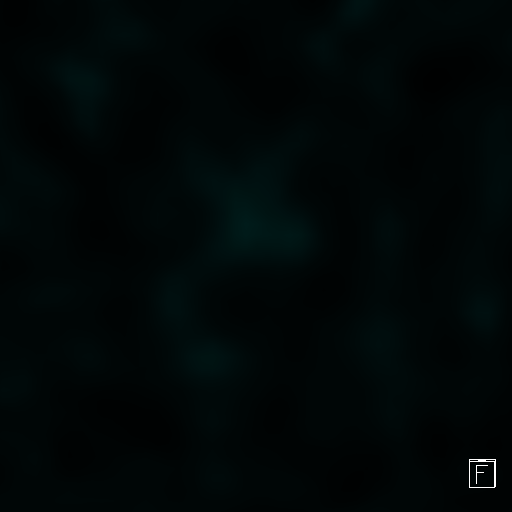
[im 4/43]
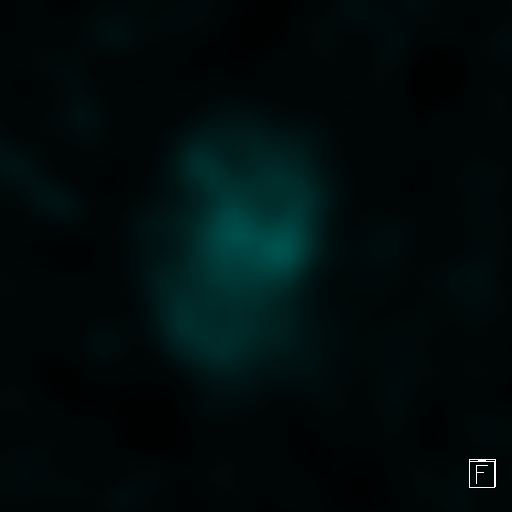
[im 6/43]
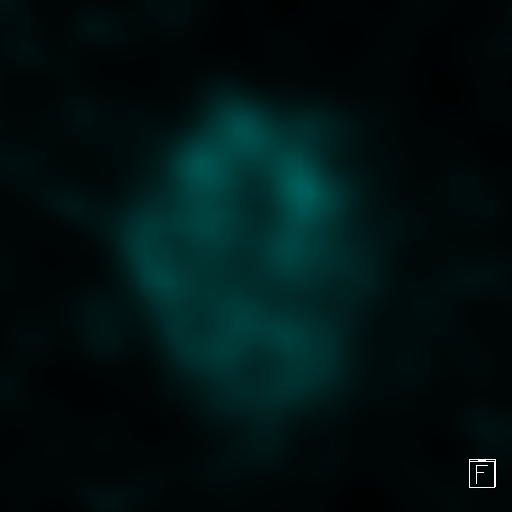
[im 8/43]
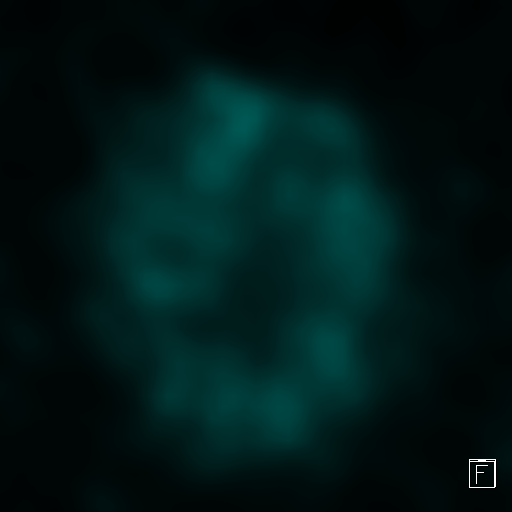
[im 12/43]
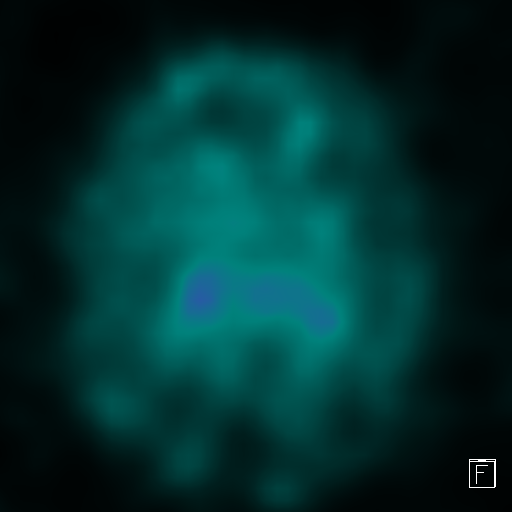
[im 14/43]
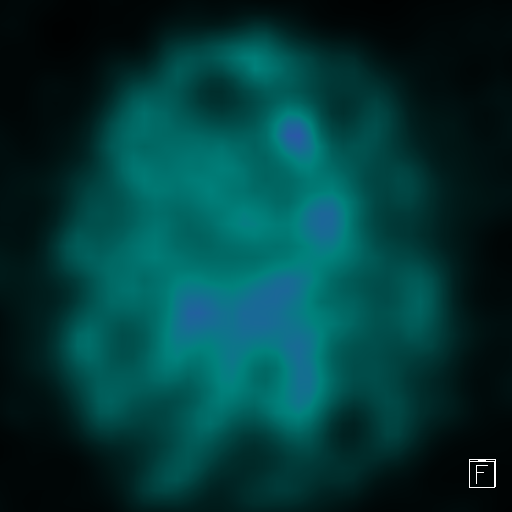
[im 16/43]
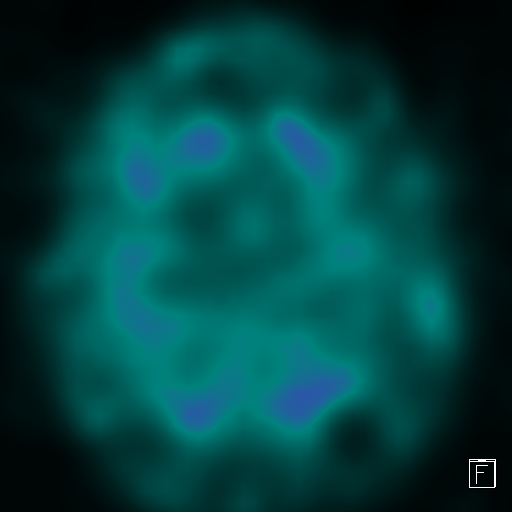
[im 18/43]
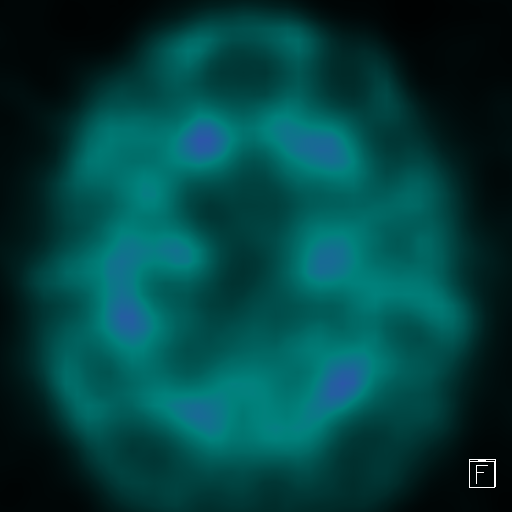
[im 22/43]
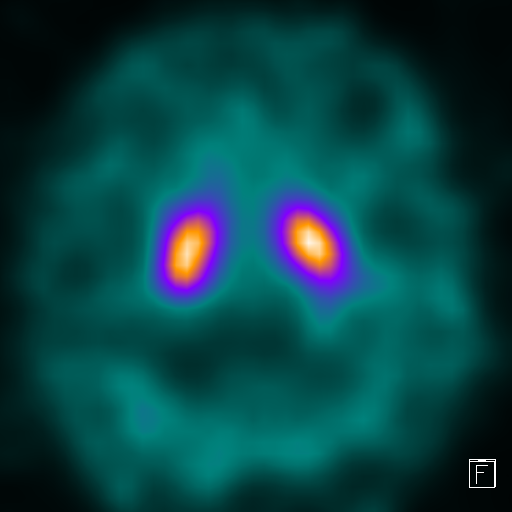
[im 23/43]
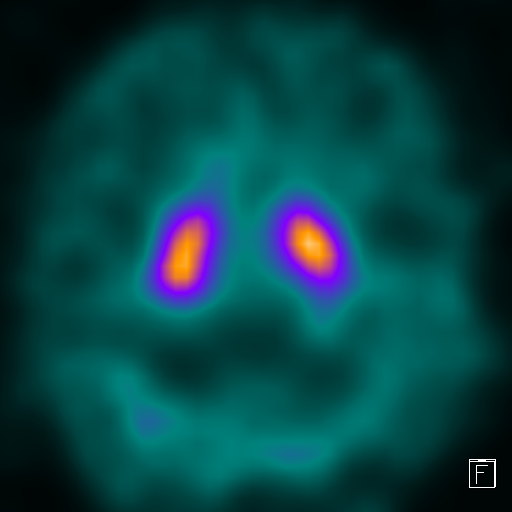
[im 25/43]
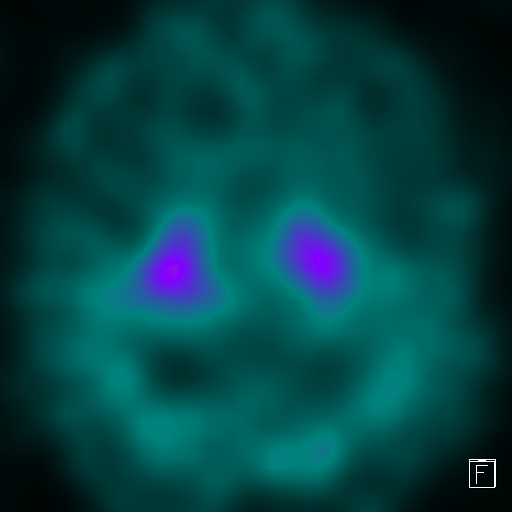
[im 27/43]
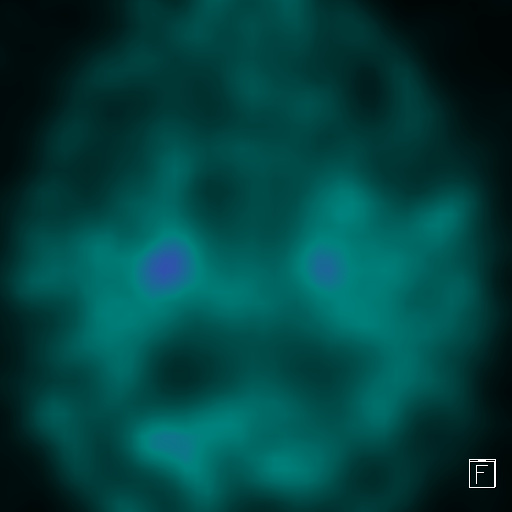
[im 31/43]
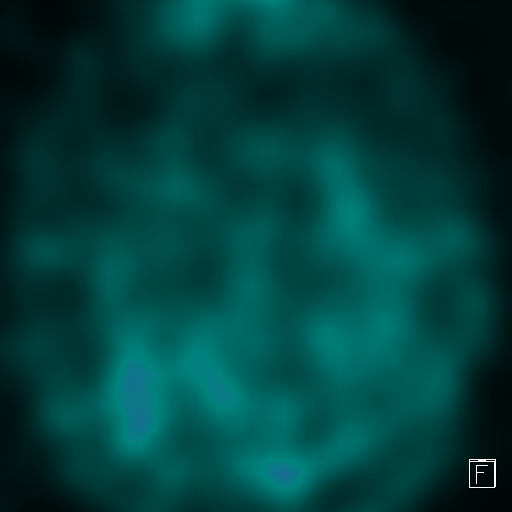
[im 33/43]
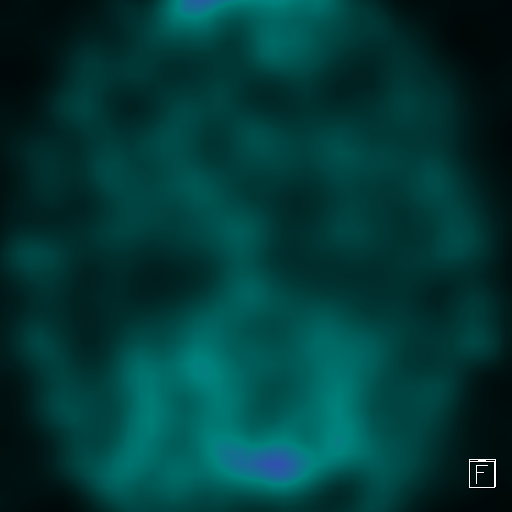
[im 35/43]
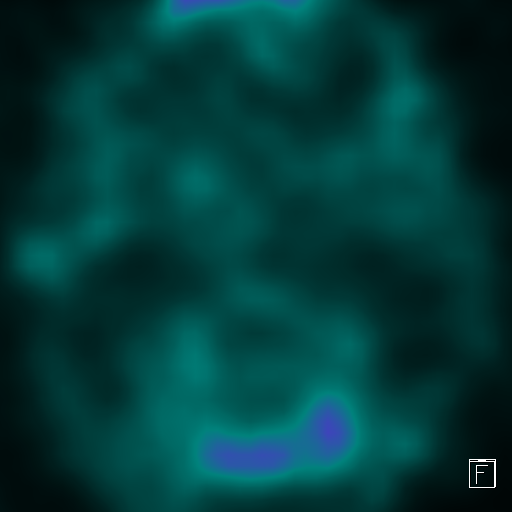
[im 37/43]
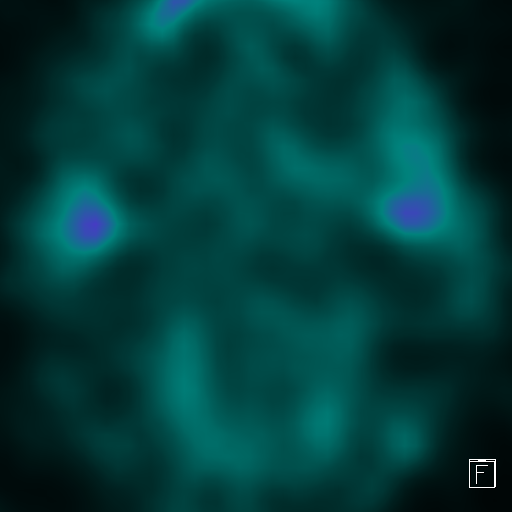
[im 41/43]
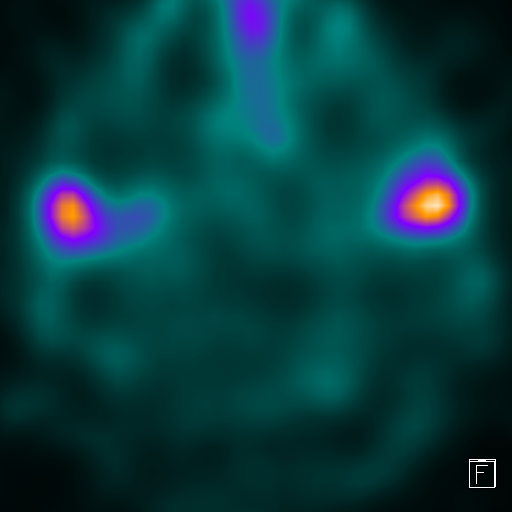
[im 43/43]
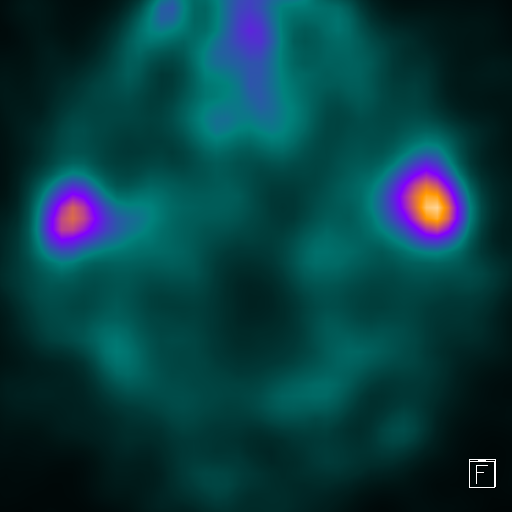

[28 of 35 positions shown; findings below may reference images not displayed]

FINDINGS: Symmetric intense uptake within LEFT and RIGHT striata. The heads of
the caudate nuclei and the posterior striata (putamen) are normal
shape. No evidence of loss of dopamine transport populations in the
basal ganglia.
IMPRESSION: Normal Ioflupane scan. No reduced radiotracer activity in basal
ganglia to suggest Parkinson's syndrome pathology.

Of note, DaTSCAN is not diagnostic of Parkinsonian syndromes, which
remains a clinical diagnosis. DaTscan is an adjuvant test to aid in
the clinical diagnosis of Parkinsonian syndromes.

## 2023-01-10 DIAGNOSIS — R262 Difficulty in walking, not elsewhere classified: Secondary | ICD-10-CM | POA: Diagnosis not present

## 2023-01-10 DIAGNOSIS — R293 Abnormal posture: Secondary | ICD-10-CM | POA: Diagnosis not present

## 2023-01-10 DIAGNOSIS — R29898 Other symptoms and signs involving the musculoskeletal system: Secondary | ICD-10-CM | POA: Diagnosis not present

## 2023-01-21 ENCOUNTER — Other Ambulatory Visit: Payer: Self-pay | Admitting: Neurology

## 2023-01-28 ENCOUNTER — Other Ambulatory Visit: Payer: Self-pay | Admitting: Neurology

## 2023-01-28 DIAGNOSIS — G250 Essential tremor: Secondary | ICD-10-CM

## 2023-01-29 NOTE — Progress Notes (Signed)
Assessment/Plan:    1.  Essential Tremor  -Continue primidone, 50 mg, he is taking all 4 po q hs and feels he is at a good balance with medication right now.  He still does have some degree of tremor.  Will let me know when refills are needed  -He will continue propranolol, 20 mg twice per day.  -DaTscan completed January, 2023.  DaTscan was negative.  Will continue to follow clinically   2.  Memory change  -Declines neurocognitive testing.  Will let me know if he changes his mind.  3.  Diabetic PN  -- We have discussed safety associated with this.  He is exercising more and has had no falls.  4.  Follow-up 1 year.  Subjective:   Antonio Douglas was seen today in follow up for essential tremor.  My previous records were reviewed prior to todays visit.  Patient reports that tremor is about the same.  He has better and worse days.  He is on primidone and tolerating the medication well.  He did some PT for balance/core and he states it helped but didn't "solve the problem."  Current prescribed movement disorder medications: Primidone, 50 mg, 2 tablets twice per day (taking all 4 q hs) Propranolol, 20 mg twice daily    ALLERGIES:  No Known Allergies  CURRENT MEDICATIONS:  Outpatient Encounter Medications as of 01/31/2023  Medication Sig   atorvastatin (LIPITOR) 40 MG tablet Take 40 mg by mouth daily.   Cetirizine HCl (ZYRTEC ALLERGY PO) Take by mouth.   ezetimibe (ZETIA) 10 MG tablet Take 10 mg by mouth daily.   GEMTESA 75 MG TABS Take 1 tablet by mouth daily.   glimepiride (AMARYL) 2 MG tablet TK 1 T PO QD WITH BRE OR THE FIRST MAIN MEAL OF THE DAY   lisinopril (PRINIVIL,ZESTRIL) 5 MG tablet Take 5 mg by mouth every morning.   metFORMIN (GLUCOPHAGE) 1000 MG tablet Take 1,000 mg by mouth 2 (two) times daily.   pioglitazone (ACTOS) 30 MG tablet Take 30 mg by mouth every evening.   primidone (MYSOLINE) 50 MG tablet TAKE 2 TABLETS BY MOUTH 2 TIMES DAILY   propranolol (INDERAL) 20  MG tablet TAKE 1 TABLET BY MOUTH 2 TIMES DAILY   sodium chloride (OCEAN) 0.65 % SOLN nasal spray Place 1 spray into both nostrils daily as needed for congestion.   tamsulosin (FLOMAX) 0.4 MG CAPS capsule Take 0.4 mg by mouth every morning.   No facility-administered encounter medications on file as of 01/31/2023.     Objective:    PHYSICAL EXAMINATION:    VITALS:   Vitals:   01/31/23 1100  BP: 120/82  Pulse: 63  SpO2: 96%  Weight: 226 lb 6.4 oz (102.7 kg)  Height: 6' (1.829 m)    GEN:  The patient appears stated age and is in NAD. HEENT:  Normocephalic, atraumatic.  The mucous membranes are moist. The superficial temporal arteries are without ropiness or tenderness. CV:  RRR Lungs: CTAB  Neurological examination:  Orientation: The patient is alert and oriented x3. Cranial nerves: There is good facial symmetry. There is facial hypomimia with lips parted.  The speech is fluent and clear. Soft palate rises symmetrically and there is no tongue deviation. Hearing is intact to conversational tone. Sensation: Sensation is intact to light touch throughout Motor: Strength is at least antigravity x4.  Movement examination: Tone: Normal tone today. Abnormal movements: there is postural tremor, R>L.  This is same as last visit.  There is mild left upper extremity rest tremor, which is same as previous.  Archimedes spirals demonstrate more tremor on the left today than the right. Gait and Station: The patient pushes off of the chair.  He walks well today.  Did not see any shuffling.    Cc:  Merri Brunette, MD

## 2023-01-31 ENCOUNTER — Encounter: Payer: Self-pay | Admitting: Neurology

## 2023-01-31 ENCOUNTER — Ambulatory Visit: Payer: Medicare Other | Admitting: Neurology

## 2023-01-31 VITALS — BP 120/82 | HR 63 | Ht 72.0 in | Wt 226.4 lb

## 2023-01-31 DIAGNOSIS — G250 Essential tremor: Secondary | ICD-10-CM | POA: Diagnosis not present

## 2023-01-31 NOTE — Patient Instructions (Signed)
No changes today!  You look good today!  The physicians and staff at [facility] are committed to providing excellent care. You may receive a survey requesting feedback about your experience at our office. We strive to receive "very good" responses to the survey questions. If you feel that your experience would prevent you from giving the office a "very good " response, please contact our office to try to remedy the situation. We may be reached at [phone]. Thank you for taking the time out of your busy day to complete the survey.

## 2023-02-05 DIAGNOSIS — M9901 Segmental and somatic dysfunction of cervical region: Secondary | ICD-10-CM | POA: Diagnosis not present

## 2023-02-05 DIAGNOSIS — M542 Cervicalgia: Secondary | ICD-10-CM | POA: Diagnosis not present

## 2023-02-05 DIAGNOSIS — M9903 Segmental and somatic dysfunction of lumbar region: Secondary | ICD-10-CM | POA: Diagnosis not present

## 2023-02-05 DIAGNOSIS — M9902 Segmental and somatic dysfunction of thoracic region: Secondary | ICD-10-CM | POA: Diagnosis not present

## 2023-02-06 DIAGNOSIS — M9902 Segmental and somatic dysfunction of thoracic region: Secondary | ICD-10-CM | POA: Diagnosis not present

## 2023-02-06 DIAGNOSIS — M9901 Segmental and somatic dysfunction of cervical region: Secondary | ICD-10-CM | POA: Diagnosis not present

## 2023-02-06 DIAGNOSIS — M542 Cervicalgia: Secondary | ICD-10-CM | POA: Diagnosis not present

## 2023-02-06 DIAGNOSIS — M9903 Segmental and somatic dysfunction of lumbar region: Secondary | ICD-10-CM | POA: Diagnosis not present

## 2023-02-11 DIAGNOSIS — M9903 Segmental and somatic dysfunction of lumbar region: Secondary | ICD-10-CM | POA: Diagnosis not present

## 2023-02-11 DIAGNOSIS — M9902 Segmental and somatic dysfunction of thoracic region: Secondary | ICD-10-CM | POA: Diagnosis not present

## 2023-02-11 DIAGNOSIS — M9901 Segmental and somatic dysfunction of cervical region: Secondary | ICD-10-CM | POA: Diagnosis not present

## 2023-02-11 DIAGNOSIS — M542 Cervicalgia: Secondary | ICD-10-CM | POA: Diagnosis not present

## 2023-02-13 DIAGNOSIS — M9901 Segmental and somatic dysfunction of cervical region: Secondary | ICD-10-CM | POA: Diagnosis not present

## 2023-02-13 DIAGNOSIS — M542 Cervicalgia: Secondary | ICD-10-CM | POA: Diagnosis not present

## 2023-02-13 DIAGNOSIS — M9902 Segmental and somatic dysfunction of thoracic region: Secondary | ICD-10-CM | POA: Diagnosis not present

## 2023-02-13 DIAGNOSIS — M9903 Segmental and somatic dysfunction of lumbar region: Secondary | ICD-10-CM | POA: Diagnosis not present

## 2023-02-18 DIAGNOSIS — M9902 Segmental and somatic dysfunction of thoracic region: Secondary | ICD-10-CM | POA: Diagnosis not present

## 2023-02-18 DIAGNOSIS — M9903 Segmental and somatic dysfunction of lumbar region: Secondary | ICD-10-CM | POA: Diagnosis not present

## 2023-02-18 DIAGNOSIS — M9901 Segmental and somatic dysfunction of cervical region: Secondary | ICD-10-CM | POA: Diagnosis not present

## 2023-02-18 DIAGNOSIS — M542 Cervicalgia: Secondary | ICD-10-CM | POA: Diagnosis not present

## 2023-02-20 DIAGNOSIS — M9901 Segmental and somatic dysfunction of cervical region: Secondary | ICD-10-CM | POA: Diagnosis not present

## 2023-02-20 DIAGNOSIS — M9903 Segmental and somatic dysfunction of lumbar region: Secondary | ICD-10-CM | POA: Diagnosis not present

## 2023-02-20 DIAGNOSIS — M542 Cervicalgia: Secondary | ICD-10-CM | POA: Diagnosis not present

## 2023-02-20 DIAGNOSIS — M9902 Segmental and somatic dysfunction of thoracic region: Secondary | ICD-10-CM | POA: Diagnosis not present

## 2023-04-26 ENCOUNTER — Other Ambulatory Visit: Payer: Self-pay | Admitting: Neurology

## 2023-04-26 DIAGNOSIS — G250 Essential tremor: Secondary | ICD-10-CM

## 2023-05-20 DIAGNOSIS — E119 Type 2 diabetes mellitus without complications: Secondary | ICD-10-CM | POA: Diagnosis not present

## 2023-06-14 DIAGNOSIS — Z7984 Long term (current) use of oral hypoglycemic drugs: Secondary | ICD-10-CM | POA: Diagnosis not present

## 2023-06-14 DIAGNOSIS — K5730 Diverticulosis of large intestine without perforation or abscess without bleeding: Secondary | ICD-10-CM | POA: Diagnosis not present

## 2023-06-14 DIAGNOSIS — Z1211 Encounter for screening for malignant neoplasm of colon: Secondary | ICD-10-CM | POA: Diagnosis not present

## 2023-06-14 DIAGNOSIS — Z86010 Personal history of colonic polyps: Secondary | ICD-10-CM | POA: Diagnosis not present

## 2023-06-14 DIAGNOSIS — Z79899 Other long term (current) drug therapy: Secondary | ICD-10-CM | POA: Diagnosis not present

## 2023-06-14 DIAGNOSIS — K635 Polyp of colon: Secondary | ICD-10-CM | POA: Diagnosis not present

## 2023-06-14 DIAGNOSIS — Z87891 Personal history of nicotine dependence: Secondary | ICD-10-CM | POA: Diagnosis not present

## 2023-06-14 DIAGNOSIS — D123 Benign neoplasm of transverse colon: Secondary | ICD-10-CM | POA: Diagnosis not present

## 2023-07-10 DIAGNOSIS — X32XXXD Exposure to sunlight, subsequent encounter: Secondary | ICD-10-CM | POA: Diagnosis not present

## 2023-07-10 DIAGNOSIS — L570 Actinic keratosis: Secondary | ICD-10-CM | POA: Diagnosis not present

## 2023-07-10 DIAGNOSIS — Z1283 Encounter for screening for malignant neoplasm of skin: Secondary | ICD-10-CM | POA: Diagnosis not present

## 2023-07-10 DIAGNOSIS — D225 Melanocytic nevi of trunk: Secondary | ICD-10-CM | POA: Diagnosis not present

## 2023-07-10 DIAGNOSIS — L820 Inflamed seborrheic keratosis: Secondary | ICD-10-CM | POA: Diagnosis not present

## 2023-07-11 DIAGNOSIS — R972 Elevated prostate specific antigen [PSA]: Secondary | ICD-10-CM | POA: Diagnosis not present

## 2023-07-11 DIAGNOSIS — N401 Enlarged prostate with lower urinary tract symptoms: Secondary | ICD-10-CM | POA: Diagnosis not present

## 2023-07-11 DIAGNOSIS — N138 Other obstructive and reflux uropathy: Secondary | ICD-10-CM | POA: Diagnosis not present

## 2023-07-25 ENCOUNTER — Other Ambulatory Visit: Payer: Self-pay | Admitting: Neurology

## 2023-07-31 ENCOUNTER — Other Ambulatory Visit: Payer: Self-pay | Admitting: Neurology

## 2023-07-31 DIAGNOSIS — G250 Essential tremor: Secondary | ICD-10-CM

## 2023-08-28 DIAGNOSIS — B078 Other viral warts: Secondary | ICD-10-CM | POA: Diagnosis not present

## 2023-10-28 DIAGNOSIS — M9901 Segmental and somatic dysfunction of cervical region: Secondary | ICD-10-CM | POA: Diagnosis not present

## 2023-10-28 DIAGNOSIS — M9902 Segmental and somatic dysfunction of thoracic region: Secondary | ICD-10-CM | POA: Diagnosis not present

## 2023-10-28 DIAGNOSIS — M9903 Segmental and somatic dysfunction of lumbar region: Secondary | ICD-10-CM | POA: Diagnosis not present

## 2023-10-28 DIAGNOSIS — M542 Cervicalgia: Secondary | ICD-10-CM | POA: Diagnosis not present

## 2023-10-30 DIAGNOSIS — M9901 Segmental and somatic dysfunction of cervical region: Secondary | ICD-10-CM | POA: Diagnosis not present

## 2023-10-30 DIAGNOSIS — M9903 Segmental and somatic dysfunction of lumbar region: Secondary | ICD-10-CM | POA: Diagnosis not present

## 2023-10-30 DIAGNOSIS — M542 Cervicalgia: Secondary | ICD-10-CM | POA: Diagnosis not present

## 2023-10-30 DIAGNOSIS — M9902 Segmental and somatic dysfunction of thoracic region: Secondary | ICD-10-CM | POA: Diagnosis not present

## 2023-11-04 DIAGNOSIS — M542 Cervicalgia: Secondary | ICD-10-CM | POA: Diagnosis not present

## 2023-11-04 DIAGNOSIS — M9902 Segmental and somatic dysfunction of thoracic region: Secondary | ICD-10-CM | POA: Diagnosis not present

## 2023-11-04 DIAGNOSIS — M9903 Segmental and somatic dysfunction of lumbar region: Secondary | ICD-10-CM | POA: Diagnosis not present

## 2023-11-04 DIAGNOSIS — M9901 Segmental and somatic dysfunction of cervical region: Secondary | ICD-10-CM | POA: Diagnosis not present

## 2023-11-14 DIAGNOSIS — I2510 Atherosclerotic heart disease of native coronary artery without angina pectoris: Secondary | ICD-10-CM | POA: Diagnosis not present

## 2023-11-14 DIAGNOSIS — Z79899 Other long term (current) drug therapy: Secondary | ICD-10-CM | POA: Diagnosis not present

## 2023-11-14 DIAGNOSIS — Z7982 Long term (current) use of aspirin: Secondary | ICD-10-CM | POA: Diagnosis not present

## 2023-11-14 DIAGNOSIS — I10 Essential (primary) hypertension: Secondary | ICD-10-CM | POA: Diagnosis not present

## 2023-11-20 ENCOUNTER — Other Ambulatory Visit: Payer: Self-pay | Admitting: Neurology

## 2023-11-20 DIAGNOSIS — G250 Essential tremor: Secondary | ICD-10-CM

## 2023-11-26 ENCOUNTER — Other Ambulatory Visit: Payer: Self-pay | Admitting: Neurology

## 2023-11-26 DIAGNOSIS — G250 Essential tremor: Secondary | ICD-10-CM

## 2023-11-29 DIAGNOSIS — L821 Other seborrheic keratosis: Secondary | ICD-10-CM | POA: Diagnosis not present

## 2023-11-29 DIAGNOSIS — L308 Other specified dermatitis: Secondary | ICD-10-CM | POA: Diagnosis not present

## 2023-11-29 DIAGNOSIS — I781 Nevus, non-neoplastic: Secondary | ICD-10-CM | POA: Diagnosis not present

## 2023-11-30 DIAGNOSIS — I440 Atrioventricular block, first degree: Secondary | ICD-10-CM | POA: Diagnosis not present

## 2024-01-14 DIAGNOSIS — E119 Type 2 diabetes mellitus without complications: Secondary | ICD-10-CM | POA: Diagnosis not present

## 2024-01-22 DIAGNOSIS — M9903 Segmental and somatic dysfunction of lumbar region: Secondary | ICD-10-CM | POA: Diagnosis not present

## 2024-01-22 DIAGNOSIS — M542 Cervicalgia: Secondary | ICD-10-CM | POA: Diagnosis not present

## 2024-01-22 DIAGNOSIS — M9902 Segmental and somatic dysfunction of thoracic region: Secondary | ICD-10-CM | POA: Diagnosis not present

## 2024-01-22 DIAGNOSIS — M9901 Segmental and somatic dysfunction of cervical region: Secondary | ICD-10-CM | POA: Diagnosis not present

## 2024-01-30 ENCOUNTER — Other Ambulatory Visit: Payer: Self-pay | Admitting: Neurology

## 2024-01-30 DIAGNOSIS — E119 Type 2 diabetes mellitus without complications: Secondary | ICD-10-CM | POA: Diagnosis not present

## 2024-01-30 DIAGNOSIS — G250 Essential tremor: Secondary | ICD-10-CM

## 2024-01-30 DIAGNOSIS — E7800 Pure hypercholesterolemia, unspecified: Secondary | ICD-10-CM | POA: Diagnosis not present

## 2024-01-30 DIAGNOSIS — I10 Essential (primary) hypertension: Secondary | ICD-10-CM | POA: Diagnosis not present

## 2024-01-30 NOTE — Progress Notes (Signed)
 Assessment/Plan:    1.  Essential Tremor  -Continue primidone , 50 mg, he is taking all 4 po q hs and feels he is at a good balance with medication right now even though tremor is still present.   -DaTscan  completed January, 2023.  DaTscan  was negative.  Will continue to follow clinically   2.  Memory change  -Declines neurocognitive testing.  Will let me know if he changes his mind.  3.  Diabetic PN  -- We have discussed safety associated with this. Exercise has greatly helped balance  4.  Told him that he could have primary care fill his medications or he can follow-up in 1 year.  He chose to follow-up in 1 year.  Subjective:   Antonio Douglas was seen today in follow up for essential tremor.  My previous records were reviewed prior to todays visit.  Patient reports that tremor is about the same.  He has better and worse days.  He is on primidone  and tolerating the medication well.  He did some PT for balance/core and he states it helped but didn't "solve the problem."  He has cut back on caffeine and that has helped.  Wife states he is exercising and doing much better with this.  Current prescribed movement disorder medications: Primidone , 50 mg, 4 po q hs Propranolol , 20 mg twice daily    ALLERGIES:  No Known Allergies  CURRENT MEDICATIONS:  Outpatient Encounter Medications as of 02/03/2024  Medication Sig   atorvastatin (LIPITOR) 40 MG tablet Take 40 mg by mouth daily.   Cetirizine HCl (ZYRTEC ALLERGY PO) Take by mouth.   ezetimibe (ZETIA) 10 MG tablet Take 10 mg by mouth daily.   GEMTESA 75 MG TABS Take 1 tablet by mouth daily.   glimepiride (AMARYL) 2 MG tablet TK 1 T PO QD WITH BRE OR THE FIRST MAIN MEAL OF THE DAY   lisinopril  (PRINIVIL ,ZESTRIL ) 5 MG tablet Take 5 mg by mouth every morning.   metFORMIN  (GLUCOPHAGE ) 1000 MG tablet Take 1,000 mg by mouth 2 (two) times daily.   pioglitazone  (ACTOS ) 30 MG tablet Take 30 mg by mouth every evening.   primidone  (MYSOLINE ) 50  MG tablet TAKE 4 TABLETS BY MOUTH AT BEDTIME   propranolol  (INDERAL ) 20 MG tablet TAKE 1 TABLET BY MOUTH 2 TIMES DAILY   sodium chloride  (OCEAN) 0.65 % SOLN nasal spray Place 1 spray into both nostrils daily as needed for congestion.   tamsulosin  (FLOMAX ) 0.4 MG CAPS capsule Take 0.4 mg by mouth every morning.   No facility-administered encounter medications on file as of 02/03/2024.     Objective:    PHYSICAL EXAMINATION:    VITALS:   Vitals:   02/03/24 1119  BP: 122/70  Pulse: 72  SpO2: 95%  Weight: 224 lb 9.6 oz (101.9 kg)  Height: 6' (1.829 m)     GEN:  The patient appears stated age and is in NAD. HEENT:  Normocephalic, atraumatic.  The mucous membranes are moist. The superficial temporal arteries are without ropiness or tenderness. CV:  RRR Lungs: CTAB  Neurological examination:  Orientation: The patient is alert and oriented x3. Cranial nerves: There is good facial symmetry. There is facial hypomimia with lips parted.  The speech is fluent and clear. Soft palate rises symmetrically and there is no tongue deviation. Hearing is decreased to conversational tone. Sensation: Sensation is intact to light touch throughout Motor: Strength is at least antigravity x4.  Movement examination: Tone: Normal tone today.  Abnormal movements: there is postural tremor, R>L.  This is same as last visit.  There is bilateral UE rest tremor.   Archimedes spirals demonstrate more tremor on the left today than the right.  Gait and Station: The patient pushes off of the chair.  He walks well today.  Did not see any shuffling.  He is minimally wide based but very steady  Total time spent on today's visit was 25 minutes, including both face-to-face time and nonface-to-face time.  Time included that spent on review of records (prior notes available to me/labs/imaging if pertinent), discussing treatment and goals, answering patient's questions and coordinating care.   Cc:  Imelda Man, MD

## 2024-02-03 ENCOUNTER — Ambulatory Visit: Payer: No Typology Code available for payment source | Admitting: Neurology

## 2024-02-03 ENCOUNTER — Encounter: Payer: Self-pay | Admitting: Neurology

## 2024-02-03 VITALS — BP 122/70 | HR 72 | Ht 72.0 in | Wt 224.6 lb

## 2024-02-03 DIAGNOSIS — G250 Essential tremor: Secondary | ICD-10-CM

## 2024-02-06 DIAGNOSIS — R972 Elevated prostate specific antigen [PSA]: Secondary | ICD-10-CM | POA: Diagnosis not present

## 2024-02-06 DIAGNOSIS — E7800 Pure hypercholesterolemia, unspecified: Secondary | ICD-10-CM | POA: Diagnosis not present

## 2024-02-06 DIAGNOSIS — N138 Other obstructive and reflux uropathy: Secondary | ICD-10-CM | POA: Diagnosis not present

## 2024-02-06 DIAGNOSIS — Z0000 Encounter for general adult medical examination without abnormal findings: Secondary | ICD-10-CM | POA: Diagnosis not present

## 2024-02-06 DIAGNOSIS — I10 Essential (primary) hypertension: Secondary | ICD-10-CM | POA: Diagnosis not present

## 2024-02-06 DIAGNOSIS — H938X3 Other specified disorders of ear, bilateral: Secondary | ICD-10-CM | POA: Diagnosis not present

## 2024-02-06 DIAGNOSIS — N401 Enlarged prostate with lower urinary tract symptoms: Secondary | ICD-10-CM | POA: Diagnosis not present

## 2024-02-13 DIAGNOSIS — E119 Type 2 diabetes mellitus without complications: Secondary | ICD-10-CM | POA: Diagnosis not present

## 2024-03-12 DIAGNOSIS — H90A22 Sensorineural hearing loss, unilateral, left ear, with restricted hearing on the contralateral side: Secondary | ICD-10-CM | POA: Diagnosis not present

## 2024-03-12 DIAGNOSIS — H90A31 Mixed conductive and sensorineural hearing loss, unilateral, right ear with restricted hearing on the contralateral side: Secondary | ICD-10-CM | POA: Diagnosis not present

## 2024-03-15 DIAGNOSIS — E119 Type 2 diabetes mellitus without complications: Secondary | ICD-10-CM | POA: Diagnosis not present

## 2024-04-01 DIAGNOSIS — M542 Cervicalgia: Secondary | ICD-10-CM | POA: Diagnosis not present

## 2024-04-01 DIAGNOSIS — M9903 Segmental and somatic dysfunction of lumbar region: Secondary | ICD-10-CM | POA: Diagnosis not present

## 2024-04-01 DIAGNOSIS — M9901 Segmental and somatic dysfunction of cervical region: Secondary | ICD-10-CM | POA: Diagnosis not present

## 2024-04-01 DIAGNOSIS — M9902 Segmental and somatic dysfunction of thoracic region: Secondary | ICD-10-CM | POA: Diagnosis not present

## 2024-04-14 DIAGNOSIS — E119 Type 2 diabetes mellitus without complications: Secondary | ICD-10-CM | POA: Diagnosis not present

## 2024-05-08 ENCOUNTER — Other Ambulatory Visit: Payer: Self-pay | Admitting: Neurology

## 2024-05-08 DIAGNOSIS — G250 Essential tremor: Secondary | ICD-10-CM

## 2024-05-15 DIAGNOSIS — E119 Type 2 diabetes mellitus without complications: Secondary | ICD-10-CM | POA: Diagnosis not present

## 2024-05-26 DIAGNOSIS — H524 Presbyopia: Secondary | ICD-10-CM | POA: Diagnosis not present

## 2024-06-15 DIAGNOSIS — E119 Type 2 diabetes mellitus without complications: Secondary | ICD-10-CM | POA: Diagnosis not present

## 2024-06-18 DIAGNOSIS — R972 Elevated prostate specific antigen [PSA]: Secondary | ICD-10-CM | POA: Diagnosis not present

## 2024-06-18 DIAGNOSIS — N138 Other obstructive and reflux uropathy: Secondary | ICD-10-CM | POA: Diagnosis not present

## 2024-06-18 DIAGNOSIS — N401 Enlarged prostate with lower urinary tract symptoms: Secondary | ICD-10-CM | POA: Diagnosis not present

## 2024-06-18 DIAGNOSIS — R3915 Urgency of urination: Secondary | ICD-10-CM | POA: Diagnosis not present

## 2024-06-30 DIAGNOSIS — E7800 Pure hypercholesterolemia, unspecified: Secondary | ICD-10-CM | POA: Diagnosis not present

## 2024-06-30 DIAGNOSIS — I2510 Atherosclerotic heart disease of native coronary artery without angina pectoris: Secondary | ICD-10-CM | POA: Diagnosis not present

## 2024-06-30 DIAGNOSIS — E1140 Type 2 diabetes mellitus with diabetic neuropathy, unspecified: Secondary | ICD-10-CM | POA: Diagnosis not present

## 2024-06-30 DIAGNOSIS — I10 Essential (primary) hypertension: Secondary | ICD-10-CM | POA: Diagnosis not present

## 2024-07-15 DIAGNOSIS — E119 Type 2 diabetes mellitus without complications: Secondary | ICD-10-CM | POA: Diagnosis not present

## 2024-08-05 ENCOUNTER — Other Ambulatory Visit: Payer: Self-pay | Admitting: Neurology

## 2024-08-05 DIAGNOSIS — G250 Essential tremor: Secondary | ICD-10-CM

## 2024-08-15 DIAGNOSIS — E119 Type 2 diabetes mellitus without complications: Secondary | ICD-10-CM | POA: Diagnosis not present

## 2024-08-20 DIAGNOSIS — N401 Enlarged prostate with lower urinary tract symptoms: Secondary | ICD-10-CM | POA: Diagnosis not present

## 2024-08-20 DIAGNOSIS — R972 Elevated prostate specific antigen [PSA]: Secondary | ICD-10-CM | POA: Diagnosis not present

## 2024-08-20 DIAGNOSIS — N138 Other obstructive and reflux uropathy: Secondary | ICD-10-CM | POA: Diagnosis not present

## 2024-11-06 ENCOUNTER — Other Ambulatory Visit: Payer: Self-pay | Admitting: Neurology

## 2024-11-06 DIAGNOSIS — G250 Essential tremor: Secondary | ICD-10-CM

## 2024-11-16 ENCOUNTER — Encounter: Payer: Self-pay | Admitting: *Deleted

## 2025-02-02 ENCOUNTER — Ambulatory Visit: Admitting: Neurology
